# Patient Record
Sex: Female | Born: 1960 | Race: White | Hispanic: No | Marital: Married | Smoking: Never smoker
Health system: Southern US, Community
[De-identification: ages and names within clinical notes are randomized; demographics above are authoritative.]

## PROBLEM LIST (undated history)

## (undated) DIAGNOSIS — T7840XA Allergy, unspecified, initial encounter: Secondary | ICD-10-CM

## (undated) DIAGNOSIS — M62838 Other muscle spasm: Secondary | ICD-10-CM

## (undated) DIAGNOSIS — Z780 Asymptomatic menopausal state: Secondary | ICD-10-CM

## (undated) DIAGNOSIS — J45909 Unspecified asthma, uncomplicated: Secondary | ICD-10-CM

## (undated) DIAGNOSIS — I10 Essential (primary) hypertension: Secondary | ICD-10-CM

## (undated) DIAGNOSIS — Z79890 Hormone replacement therapy (postmenopausal): Secondary | ICD-10-CM

## (undated) DIAGNOSIS — K219 Gastro-esophageal reflux disease without esophagitis: Secondary | ICD-10-CM

## (undated) DIAGNOSIS — G43909 Migraine, unspecified, not intractable, without status migrainosus: Secondary | ICD-10-CM

## (undated) HISTORY — DX: Allergy, unspecified, initial encounter: T78.40XA

## (undated) HISTORY — DX: Asymptomatic menopausal state: Z78.0

## (undated) HISTORY — PX: FRACTURE SURGERY: SHX138

## (undated) HISTORY — PX: LASIK: SHX215

## (undated) HISTORY — DX: Unspecified asthma, uncomplicated: J45.909

## (undated) HISTORY — DX: Gastro-esophageal reflux disease without esophagitis: K21.9

## (undated) HISTORY — DX: Hormone replacement therapy: Z79.890

## (undated) HISTORY — DX: Other muscle spasm: M62.838

## (undated) HISTORY — DX: Essential (primary) hypertension: I10

## (undated) HISTORY — PX: APPENDECTOMY: SHX54

## (undated) HISTORY — PX: EYE SURGERY: SHX253

## (undated) HISTORY — PX: WRIST FRACTURE SURGERY: SHX121

## (undated) HISTORY — DX: Migraine, unspecified, not intractable, without status migrainosus: G43.909

---

## 2010-05-26 ENCOUNTER — Ambulatory Visit: Payer: Self-pay

## 2010-06-02 ENCOUNTER — Ambulatory Visit: Payer: Self-pay

## 2011-06-01 ENCOUNTER — Ambulatory Visit: Payer: Self-pay | Admitting: Family Medicine

## 2011-06-28 ENCOUNTER — Ambulatory Visit: Payer: Self-pay | Admitting: Family Medicine

## 2012-06-13 ENCOUNTER — Ambulatory Visit: Payer: Self-pay | Admitting: Family Medicine

## 2012-09-18 HISTORY — PX: COLONOSCOPY: SHX174

## 2013-06-17 ENCOUNTER — Ambulatory Visit: Payer: Self-pay | Admitting: Family Medicine

## 2014-03-23 ENCOUNTER — Encounter (INDEPENDENT_AMBULATORY_CARE_PROVIDER_SITE_OTHER): Payer: Self-pay

## 2014-06-24 ENCOUNTER — Ambulatory Visit: Payer: Self-pay | Admitting: Family Medicine

## 2015-03-30 ENCOUNTER — Other Ambulatory Visit: Payer: Self-pay

## 2015-05-14 ENCOUNTER — Other Ambulatory Visit: Payer: Self-pay | Admitting: Family Medicine

## 2015-05-14 DIAGNOSIS — I10 Essential (primary) hypertension: Secondary | ICD-10-CM

## 2015-06-28 ENCOUNTER — Other Ambulatory Visit: Payer: Self-pay | Admitting: Family Medicine

## 2015-06-28 DIAGNOSIS — Z1231 Encounter for screening mammogram for malignant neoplasm of breast: Secondary | ICD-10-CM

## 2015-07-08 ENCOUNTER — Ambulatory Visit
Admission: RE | Admit: 2015-07-08 | Discharge: 2015-07-08 | Disposition: A | Discharge status: Home / Self Care | Payer: 59 | Source: Ambulatory Visit | Attending: Family Medicine | Admitting: Family Medicine

## 2015-07-08 DIAGNOSIS — Z1231 Encounter for screening mammogram for malignant neoplasm of breast: Secondary | ICD-10-CM | POA: Diagnosis not present

## 2015-07-26 ENCOUNTER — Other Ambulatory Visit: Payer: Self-pay

## 2015-08-30 ENCOUNTER — Other Ambulatory Visit: Payer: Self-pay | Admitting: Family Medicine

## 2015-09-07 ENCOUNTER — Other Ambulatory Visit: Payer: Self-pay | Admitting: Family Medicine

## 2015-10-07 DIAGNOSIS — Z1283 Encounter for screening for malignant neoplasm of skin: Secondary | ICD-10-CM | POA: Diagnosis not present

## 2015-10-07 DIAGNOSIS — Z872 Personal history of diseases of the skin and subcutaneous tissue: Secondary | ICD-10-CM | POA: Diagnosis not present

## 2015-10-07 DIAGNOSIS — L720 Epidermal cyst: Secondary | ICD-10-CM | POA: Diagnosis not present

## 2015-11-04 ENCOUNTER — Other Ambulatory Visit: Payer: Self-pay

## 2015-11-04 DIAGNOSIS — G43009 Migraine without aura, not intractable, without status migrainosus: Secondary | ICD-10-CM

## 2015-11-04 DIAGNOSIS — J019 Acute sinusitis, unspecified: Secondary | ICD-10-CM

## 2015-11-04 MED ORDER — ALBUTEROL SULFATE HFA 108 (90 BASE) MCG/ACT IN AERS: 2.0000 | INHALATION_SPRAY | Freq: Four times a day (QID) | RESPIRATORY_TRACT | Status: DC | PRN

## 2015-11-04 MED ORDER — RIZATRIPTAN BENZOATE 10 MG PO TABS: 10.0000 mg | ORAL_TABLET | ORAL | Status: DC | PRN

## 2015-11-04 MED ORDER — DOXYCYCLINE HYCLATE 100 MG PO TABS: 100.0000 mg | ORAL_TABLET | Freq: Two times a day (BID) | ORAL | Status: DC

## 2015-11-26 ENCOUNTER — Other Ambulatory Visit: Payer: Self-pay | Admitting: Family Medicine

## 2015-12-09 DIAGNOSIS — H5203 Hypermetropia, bilateral: Secondary | ICD-10-CM | POA: Diagnosis not present

## 2016-02-15 ENCOUNTER — Other Ambulatory Visit: Payer: Self-pay | Admitting: Family Medicine

## 2016-02-21 ENCOUNTER — Other Ambulatory Visit: Payer: Self-pay | Admitting: Family Medicine

## 2016-05-23 ENCOUNTER — Other Ambulatory Visit: Payer: Self-pay | Admitting: Family Medicine

## 2016-06-14 ENCOUNTER — Other Ambulatory Visit: Payer: Self-pay | Admitting: Family Medicine

## 2016-06-14 DIAGNOSIS — Z1231 Encounter for screening mammogram for malignant neoplasm of breast: Secondary | ICD-10-CM

## 2016-06-20 ENCOUNTER — Other Ambulatory Visit: Payer: Self-pay | Admitting: Family Medicine

## 2016-06-20 DIAGNOSIS — J019 Acute sinusitis, unspecified: Secondary | ICD-10-CM

## 2016-07-06 ENCOUNTER — Other Ambulatory Visit: Payer: Self-pay

## 2016-07-06 DIAGNOSIS — Z23 Encounter for immunization: Secondary | ICD-10-CM

## 2016-07-10 ENCOUNTER — Ambulatory Visit
Admission: RE | Admit: 2016-07-10 | Discharge: 2016-07-10 | Disposition: A | Discharge status: Home / Self Care | Payer: 59 | Source: Ambulatory Visit | Attending: Family Medicine | Admitting: Family Medicine

## 2016-07-10 ENCOUNTER — Other Ambulatory Visit: Payer: Self-pay | Admitting: Family Medicine

## 2016-07-10 DIAGNOSIS — Z1231 Encounter for screening mammogram for malignant neoplasm of breast: Secondary | ICD-10-CM

## 2016-08-02 ENCOUNTER — Other Ambulatory Visit: Payer: Self-pay | Admitting: Family Medicine

## 2016-10-12 DIAGNOSIS — G548 Other nerve root and plexus disorders: Secondary | ICD-10-CM | POA: Diagnosis not present

## 2016-10-12 DIAGNOSIS — Z86018 Personal history of other benign neoplasm: Secondary | ICD-10-CM | POA: Diagnosis not present

## 2016-10-12 DIAGNOSIS — D485 Neoplasm of uncertain behavior of skin: Secondary | ICD-10-CM | POA: Diagnosis not present

## 2016-11-13 ENCOUNTER — Other Ambulatory Visit: Payer: Self-pay

## 2016-11-13 MED ORDER — LOSARTAN POTASSIUM-HCTZ 50-12.5 MG PO TABS: 1.0000 | ORAL_TABLET | Freq: Every day | ORAL | 1 refills | Status: DC

## 2016-12-14 DIAGNOSIS — H5203 Hypermetropia, bilateral: Secondary | ICD-10-CM | POA: Diagnosis not present

## 2017-01-24 ENCOUNTER — Other Ambulatory Visit: Payer: Self-pay

## 2017-01-24 MED ORDER — CIPROFLOXACIN HCL 500 MG PO TABS: 500.0000 mg | ORAL_TABLET | Freq: Two times a day (BID) | ORAL | 1 refills | Status: DC

## 2017-01-24 MED ORDER — FLUCONAZOLE 150 MG PO TABS: 150.0000 mg | ORAL_TABLET | Freq: Once | ORAL | 1 refills | Status: AC

## 2017-01-24 MED ORDER — METHOCARBAMOL 500 MG PO TABS: 500.0000 mg | ORAL_TABLET | Freq: Four times a day (QID) | ORAL | 0 refills | Status: DC

## 2017-01-29 ENCOUNTER — Other Ambulatory Visit: Payer: Self-pay | Admitting: Family Medicine

## 2017-01-29 DIAGNOSIS — G43009 Migraine without aura, not intractable, without status migrainosus: Secondary | ICD-10-CM

## 2017-04-27 ENCOUNTER — Other Ambulatory Visit: Payer: Self-pay

## 2017-04-27 ENCOUNTER — Other Ambulatory Visit: Payer: Self-pay | Admitting: Family Medicine

## 2017-04-27 DIAGNOSIS — E785 Hyperlipidemia, unspecified: Secondary | ICD-10-CM | POA: Diagnosis not present

## 2017-04-27 DIAGNOSIS — I10 Essential (primary) hypertension: Secondary | ICD-10-CM | POA: Diagnosis not present

## 2017-04-28 LAB — CBC WITH DIFFERENTIAL/PLATELET
BASOS ABS: 0 10*3/uL (ref 0.0–0.2)
Basos: 1 %
EOS (ABSOLUTE): 0.1 10*3/uL (ref 0.0–0.4)
Eos: 2 %
Hematocrit: 43.1 % (ref 34.0–46.6)
Hemoglobin: 13.9 g/dL (ref 11.1–15.9)
IMMATURE GRANS (ABS): 0 10*3/uL (ref 0.0–0.1)
IMMATURE GRANULOCYTES: 0 %
LYMPHS: 28 %
Lymphocytes Absolute: 1.6 10*3/uL (ref 0.7–3.1)
MCH: 29.4 pg (ref 26.6–33.0)
MCHC: 32.3 g/dL (ref 31.5–35.7)
MCV: 91 fL (ref 79–97)
Monocytes Absolute: 0.5 10*3/uL (ref 0.1–0.9)
Monocytes: 9 %
NEUTROS PCT: 60 %
Neutrophils Absolute: 3.5 10*3/uL (ref 1.4–7.0)
PLATELETS: 273 10*3/uL (ref 150–379)
RBC: 4.72 x10E6/uL (ref 3.77–5.28)
RDW: 14.1 % (ref 12.3–15.4)
WBC: 5.7 10*3/uL (ref 3.4–10.8)

## 2017-04-28 LAB — COMPREHENSIVE METABOLIC PANEL
ALT: 30 IU/L (ref 0–32)
AST: 27 IU/L (ref 0–40)
Albumin/Globulin Ratio: 1.5 (ref 1.2–2.2)
Albumin: 4.6 g/dL (ref 3.5–5.5)
Alkaline Phosphatase: 94 IU/L (ref 39–117)
BILIRUBIN TOTAL: 0.3 mg/dL (ref 0.0–1.2)
BUN/Creatinine Ratio: 18 (ref 9–23)
BUN: 18 mg/dL (ref 6–24)
CALCIUM: 9.8 mg/dL (ref 8.7–10.2)
CHLORIDE: 99 mmol/L (ref 96–106)
CO2: 26 mmol/L (ref 20–29)
Creatinine, Ser: 0.98 mg/dL (ref 0.57–1.00)
GFR calc non Af Amer: 64 mL/min/{1.73_m2} (ref >59)
GFR, EST AFRICAN AMERICAN: 74 mL/min/{1.73_m2} (ref >59)
GLUCOSE: 89 mg/dL (ref 65–99)
Globulin, Total: 3.1 g/dL (ref 1.5–4.5)
Potassium: 4.3 mmol/L (ref 3.5–5.2)
Sodium: 138 mmol/L (ref 134–144)
TOTAL PROTEIN: 7.7 g/dL (ref 6.0–8.5)

## 2017-04-28 LAB — LIPID PANEL
Chol/HDL Ratio: 3 ratio (ref 0.0–4.4)
Cholesterol, Total: 157 mg/dL (ref 100–199)
HDL: 52 mg/dL (ref >39)
LDL Calculated: 93 mg/dL (ref 0–99)
TRIGLYCERIDES: 60 mg/dL (ref 0–149)
VLDL CHOLESTEROL CAL: 12 mg/dL (ref 5–40)

## 2017-04-28 LAB — TSH: TSH: 2.04 u[IU]/mL (ref 0.450–4.500)

## 2017-05-07 ENCOUNTER — Other Ambulatory Visit: Payer: Self-pay

## 2017-05-07 MED ORDER — RANITIDINE HCL 150 MG PO TABS: 150.0000 mg | ORAL_TABLET | Freq: Two times a day (BID) | ORAL | 2 refills | Status: DC

## 2017-05-08 ENCOUNTER — Other Ambulatory Visit: Payer: Self-pay | Admitting: Family Medicine

## 2017-05-24 DIAGNOSIS — L578 Other skin changes due to chronic exposure to nonionizing radiation: Secondary | ICD-10-CM | POA: Diagnosis not present

## 2017-05-24 DIAGNOSIS — D485 Neoplasm of uncertain behavior of skin: Secondary | ICD-10-CM | POA: Diagnosis not present

## 2017-05-24 DIAGNOSIS — L918 Other hypertrophic disorders of the skin: Secondary | ICD-10-CM | POA: Diagnosis not present

## 2017-05-24 DIAGNOSIS — Z86018 Personal history of other benign neoplasm: Secondary | ICD-10-CM | POA: Diagnosis not present

## 2017-05-24 DIAGNOSIS — Z872 Personal history of diseases of the skin and subcutaneous tissue: Secondary | ICD-10-CM | POA: Diagnosis not present

## 2017-05-24 DIAGNOSIS — L821 Other seborrheic keratosis: Secondary | ICD-10-CM | POA: Diagnosis not present

## 2017-06-21 ENCOUNTER — Other Ambulatory Visit: Payer: Self-pay | Admitting: Family Medicine

## 2017-06-21 DIAGNOSIS — Z1231 Encounter for screening mammogram for malignant neoplasm of breast: Secondary | ICD-10-CM

## 2017-07-05 DIAGNOSIS — D485 Neoplasm of uncertain behavior of skin: Secondary | ICD-10-CM | POA: Diagnosis not present

## 2017-07-05 DIAGNOSIS — D229 Melanocytic nevi, unspecified: Secondary | ICD-10-CM | POA: Diagnosis not present

## 2017-07-06 ENCOUNTER — Other Ambulatory Visit: Payer: Self-pay

## 2017-07-17 ENCOUNTER — Other Ambulatory Visit: Payer: Self-pay

## 2017-07-17 MED ORDER — PREDNISONE 10 MG PO TABS: 10.0000 mg | ORAL_TABLET | Freq: Every day | ORAL | 0 refills | Status: DC

## 2017-07-18 ENCOUNTER — Ambulatory Visit
Admission: RE | Admit: 2017-07-18 | Discharge: 2017-07-18 | Disposition: A | Discharge status: Home / Self Care | Payer: 59 | Source: Ambulatory Visit | Attending: Family Medicine | Admitting: Family Medicine

## 2017-07-18 DIAGNOSIS — Z1231 Encounter for screening mammogram for malignant neoplasm of breast: Secondary | ICD-10-CM | POA: Diagnosis not present

## 2017-08-28 ENCOUNTER — Other Ambulatory Visit: Payer: Self-pay | Admitting: Family Medicine

## 2017-08-31 ENCOUNTER — Other Ambulatory Visit: Payer: Self-pay | Admitting: Family Medicine

## 2017-10-05 ENCOUNTER — Ambulatory Visit: Payer: 59

## 2017-10-05 ENCOUNTER — Ambulatory Visit
Admission: RE | Admit: 2017-10-05 | Discharge: 2017-10-05 | Disposition: A | Discharge status: Home / Self Care | Payer: 59 | Source: Ambulatory Visit | Attending: Family Medicine | Admitting: Family Medicine

## 2017-10-05 ENCOUNTER — Other Ambulatory Visit: Payer: Self-pay | Admitting: Family Medicine

## 2017-10-05 DIAGNOSIS — M25551 Pain in right hip: Secondary | ICD-10-CM

## 2017-10-05 DIAGNOSIS — M25552 Pain in left hip: Secondary | ICD-10-CM

## 2017-10-05 DIAGNOSIS — R102 Pelvic and perineal pain: Secondary | ICD-10-CM

## 2017-10-05 DIAGNOSIS — M545 Low back pain, unspecified: Secondary | ICD-10-CM

## 2017-10-05 DIAGNOSIS — M5136 Other intervertebral disc degeneration, lumbar region: Secondary | ICD-10-CM | POA: Insufficient documentation

## 2017-10-05 DIAGNOSIS — M549 Dorsalgia, unspecified: Secondary | ICD-10-CM | POA: Diagnosis not present

## 2017-10-18 DIAGNOSIS — M5440 Lumbago with sciatica, unspecified side: Secondary | ICD-10-CM | POA: Diagnosis not present

## 2017-10-18 DIAGNOSIS — E669 Obesity, unspecified: Secondary | ICD-10-CM | POA: Diagnosis not present

## 2017-10-18 DIAGNOSIS — M5416 Radiculopathy, lumbar region: Secondary | ICD-10-CM | POA: Diagnosis not present

## 2017-10-18 DIAGNOSIS — M5136 Other intervertebral disc degeneration, lumbar region: Secondary | ICD-10-CM | POA: Diagnosis not present

## 2017-11-05 ENCOUNTER — Other Ambulatory Visit: Payer: Self-pay | Admitting: Family Medicine

## 2017-11-06 ENCOUNTER — Ambulatory Visit: Payer: Self-pay | Admitting: Physical Therapy

## 2017-11-13 ENCOUNTER — Ambulatory Visit: Payer: 59 | Admitting: Physical Therapy

## 2017-12-13 LAB — TSH: TSH: 0.97 (ref 0.41–5.90)

## 2017-12-13 LAB — CBC AND DIFFERENTIAL
HCT: 39 (ref 36–46)
Hemoglobin: 13.6 (ref 12.0–16.0)
Platelets: 289 (ref 150–399)
WBC: 5.9

## 2017-12-13 LAB — HEPATIC FUNCTION PANEL
ALT: 14 (ref 7–35)
AST: 15 (ref 13–35)

## 2017-12-13 LAB — BASIC METABOLIC PANEL
BUN: 16 (ref 4–21)
Glucose: 86
POTASSIUM: 4.3 (ref 3.4–5.3)
SODIUM: 139 (ref 137–147)

## 2017-12-13 LAB — LIPID PANEL
CHOLESTEROL: 159 (ref 0–200)
HDL: 48 (ref 35–70)
LDL Cholesterol: 100
Triglycerides: 57 (ref 40–160)

## 2017-12-17 ENCOUNTER — Other Ambulatory Visit: Payer: Self-pay

## 2017-12-20 DIAGNOSIS — H5203 Hypermetropia, bilateral: Secondary | ICD-10-CM | POA: Diagnosis not present

## 2018-01-31 ENCOUNTER — Ambulatory Visit (INDEPENDENT_AMBULATORY_CARE_PROVIDER_SITE_OTHER): Payer: 59 | Admitting: Family Medicine

## 2018-01-31 ENCOUNTER — Encounter: Payer: Self-pay | Admitting: Family Medicine

## 2018-01-31 VITALS — BP 120/78 | HR 80 | Ht 64.0 in | Wt 172.0 lb

## 2018-01-31 DIAGNOSIS — R002 Palpitations: Secondary | ICD-10-CM | POA: Diagnosis not present

## 2018-01-31 DIAGNOSIS — I10 Essential (primary) hypertension: Secondary | ICD-10-CM

## 2018-01-31 DIAGNOSIS — N951 Menopausal and female climacteric states: Secondary | ICD-10-CM | POA: Diagnosis not present

## 2018-01-31 DIAGNOSIS — M51369 Other intervertebral disc degeneration, lumbar region without mention of lumbar back pain or lower extremity pain: Secondary | ICD-10-CM

## 2018-01-31 DIAGNOSIS — M5136 Other intervertebral disc degeneration, lumbar region: Secondary | ICD-10-CM

## 2018-01-31 DIAGNOSIS — K219 Gastro-esophageal reflux disease without esophagitis: Secondary | ICD-10-CM | POA: Diagnosis not present

## 2018-01-31 DIAGNOSIS — Z0001 Encounter for general adult medical examination with abnormal findings: Secondary | ICD-10-CM | POA: Diagnosis not present

## 2018-01-31 DIAGNOSIS — Z0000 Encounter for general adult medical examination without abnormal findings: Secondary | ICD-10-CM

## 2018-01-31 DIAGNOSIS — M62838 Other muscle spasm: Secondary | ICD-10-CM | POA: Insufficient documentation

## 2018-01-31 DIAGNOSIS — G43909 Migraine, unspecified, not intractable, without status migrainosus: Secondary | ICD-10-CM | POA: Insufficient documentation

## 2018-01-31 NOTE — Progress Notes (Signed)
Name: Erica Snyder   MRN: [id number]    DOB: April 08, 1961   Date:01/31/2018       Progress Note  Subjective  Chief Complaint  Chief Complaint  Patient presents with  . Annual Exam    no pap or mammo    Patient present for annual physical exam.  Patient on no medications.   No problem-specific Assessment & Plan notes found for this encounter.   Past Medical History:  Diagnosis Date  . Asthma   . GERD (gastroesophageal reflux disease)   . Hormone replacement therapy (HRT)   . Hypertension   . Menopause   . Migraine   . Migraine   . Neck muscle spasm     Past Surgical History:  Procedure Laterality Date  . APPENDECTOMY    . COLONOSCOPY  2014   polyps  . LASIK    . WRIST FRACTURE SURGERY Right     Family History  Problem Relation Age of Onset  . Hypertension Mother   . Hypertension Father   . Heart disease Father   . Stroke Father   . Breast cancer Neg Hx     Social History   Socioeconomic History  . Marital status: Married    Spouse name: Not on file  . Number of children: Not on file  . Years of education: Not on file  . Highest education level: Not on file  Occupational History  . Not on file  Social Needs  . Financial resource strain: Not on file  . Food insecurity:    Worry: Not on file    Inability: Not on file  . Transportation needs:    Medical: Not on file    Non-medical: Not on file  Tobacco Use  . Smoking status: Never Smoker  . Smokeless tobacco: Never Used  Substance and Sexual Activity  . Alcohol use: Yes    Alcohol/week: 0.0 oz  . Drug use: No  . Sexual activity: Yes  Lifestyle  . Physical activity:    Days per week: Not on file    Minutes per session: Not on file  . Stress: Not on file  Relationships  . Social connections:    Talks on phone: Not on file    Gets together: Not on file    Attends religious service: Not on file    Active member of club or organization: Not on file    Attends meetings of clubs or  organizations: Not on file    Relationship status: Not on file  . Intimate partner violence:    Fear of current or ex partner: Not on file    Emotionally abused: Not on file    Physically abused: Not on file    Forced sexual activity: Not on file  Other Topics Concern  . Not on file  Social History Narrative  . Not on file    Allergies  Allergen Reactions  . Clarithromycin Nausea And Vomiting and Other (See Comments)    Stomach pain   . Levofloxacin Other (See Comments)  . Sulfa Antibiotics Rash    Outpatient Medications Prior to Visit  Medication Sig Dispense Refill  . albuterol (VENTOLIN HFA) 108 (90 Base) MCG/ACT inhaler Inhale 1 puff into the lungs as needed.    . butalbital-acetaminophen-caffeine (FIORICET, ESGIC) 50-325-40 MG tablet TAKE 1 TABLET BY MOUTH AS NEEDED 100 tablet 0  . Butalbital-APAP-Caff-Cod 50-300-40-30 MG CAPS Take 1 capsule by mouth as needed.    . Calcium Citrate-Vitamin D (CALCIUM + D  PO) Take 1 tablet by mouth daily.    . Calcium-Vitamin D (CALTRATE 600 PLUS-VIT D PO) Take 2 tablets by mouth daily.    . ciprofloxacin (CIPRO) 500 MG tablet Take 1 tablet (500 mg total) by mouth 2 (two) times daily. 20 tablet 1  . Cyanocobalamin (VITAMIN B12) 1000 MCG TBCR Take 1 tablet by mouth daily.    Marland Kitchen estradiol (ESTRACE) 0.5 MG tablet TAKE 1 TABLET BY MOUTH ONCE DAILY 90 tablet 0  . estradiol (ESTRACE) 0.5 MG tablet Take 1 tablet by mouth daily.    Marland Kitchen losartan-hydrochlorothiazide (HYZAAR) 50-12.5 MG tablet TAKE 1 TABLET BY MOUTH DAILY 90 tablet 1  . losartan-hydrochlorothiazide (HYZAAR) 50-12.5 MG tablet Take 1 tablet by mouth daily.    . medroxyPROGESTERone (PROVERA) 5 MG tablet Take 2.5 mg by mouth daily.    . methocarbamol (ROBAXIN) 500 MG tablet TAKE 1 TABLET BY MOUTH FOUR TIMES DAILY 100 tablet 0  . methocarbamol (ROBAXIN) 500 MG tablet Take 1 tablet by mouth as needed.    . norethindrone (AYGESTIN) 5 MG tablet TAKE 1 TABLET BY MOUTH ONCE DAILY 90 tablet 1  .  norethindrone (AYGESTIN) 5 MG tablet Take 0.25 mg by mouth daily.    . ranitidine (ZANTAC) 150 MG tablet Take 1 tablet (150 mg total) by mouth 2 (two) times daily. 180 tablet 2  . ranitidine (ZANTAC) 150 MG tablet Take 1 tablet by mouth daily.    . rizatriptan (MAXALT) 10 MG tablet TAKE 1 TABLET BY MOUTH AS NEEDED FOR MIGRAINE. MAY REPEAT IN 2 HOURS IF NEEDED. 27 tablet 1  . VENTOLIN HFA 108 (90 Base) MCG/ACT inhaler INHALE 2 PUFFS INTO THE LUNGS EVERY SIX HOURS AS NEEDED FOR WHEEZING OR SHORTNESS OF BREATH 54 g 0  . predniSONE (DELTASONE) 10 MG tablet Take 1 tablet (10 mg total) by mouth daily with breakfast. Taper- 4,4,4,3,3,3,2,2,2,1,1,1 30 tablet 0   No facility-administered medications prior to visit.     Review of Systems  Constitutional: Negative for chills, fever, malaise/fatigue and weight loss.  HENT: Negative for ear discharge, ear pain, nosebleeds and sore throat.   Eyes: Negative for blurred vision and photophobia.  Respiratory: Negative for cough, sputum production, shortness of breath and wheezing.   Cardiovascular: Negative for chest pain, palpitations and leg swelling.  Gastrointestinal: Negative for abdominal pain, blood in stool, constipation, diarrhea, heartburn, melena and nausea.  Genitourinary: Negative for dysuria, frequency, hematuria and urgency.  Musculoskeletal: Positive for back pain. Negative for joint pain, myalgias and neck pain.  Skin: Negative for rash.  Neurological: Negative for dizziness, tingling, sensory change, focal weakness and headaches.  Endo/Heme/Allergies: Negative for environmental allergies and polydipsia. Does not bruise/bleed easily.  Psychiatric/Behavioral: Negative for depression and suicidal ideas. The patient is not nervous/anxious and does not have insomnia.      Objective  Vitals:   01/31/18 1143  BP: 120/78  Pulse: 80  Weight: 172 lb (78 kg)  Height: 5\' 4"  (1.626 m)    Physical Exam  Constitutional: She is oriented to  person, place, and time. She appears well-developed and well-nourished.  HENT:  Head: Normocephalic.  Right Ear: Hearing, tympanic membrane, external ear and ear canal normal.  Left Ear: Hearing, tympanic membrane, external ear and ear canal normal.  Nose: Nose normal.  Mouth/Throat: Uvula is midline, oropharynx is clear and moist and mucous membranes are normal. No oropharyngeal exudate, posterior oropharyngeal edema or posterior oropharyngeal erythema.  Eyes: Pupils are equal, round, and reactive to light. Conjunctivae, EOM and  lids are normal. Lids are everted and swept, no foreign bodies found. Left eye exhibits no hordeolum. No foreign body present in the left eye. Right conjunctiva is not injected. Left conjunctiva is not injected. No scleral icterus.  Fundoscopic exam:      The right eye shows no arteriolar narrowing and no AV nicking.       The left eye shows no arteriolar narrowing and no AV nicking.  Neck: Trachea normal and normal range of motion. Neck supple. Normal carotid pulses, no hepatojugular reflux and no JVD present. Carotid bruit is not present. No tracheal deviation present. No thyroid mass and no thyromegaly present.  Cardiovascular: Normal rate, regular rhythm, S1 normal, S2 normal, normal heart sounds and intact distal pulses. Exam reveals no gallop and no friction rub.  No murmur heard. Pulses:      Carotid pulses are 2+ on the right side, and 2+ on the left side.      Radial pulses are 2+ on the right side, and 2+ on the left side.       Femoral pulses are 2+ on the right side, and 2+ on the left side.      Popliteal pulses are 2+ on the right side, and 2+ on the left side.       Dorsalis pedis pulses are 0 on the right side, and 0 on the left side.       Posterior tibial pulses are 1+ on the right side, and 1+ on the left side.  Pulmonary/Chest: Effort normal and breath sounds normal. No respiratory distress. She has no wheezes. She has no rales.  Abdominal: Soft.  Bowel sounds are normal. She exhibits no mass. There is no hepatosplenomegaly. There is no tenderness. There is no rebound and no guarding.  Musculoskeletal: Normal range of motion. She exhibits no edema or tenderness.       Lumbar back: Normal.  Lymphadenopathy:       Head (right side): No submental and no submandibular adenopathy present.       Head (left side): No submental and no submandibular adenopathy present.    She has no cervical adenopathy.  Neurological: She is alert and oriented to person, place, and time. She has normal strength. She displays normal reflexes. No cranial nerve deficit or sensory deficit.  Reflex Scores:      Tricep reflexes are 2+ on the right side and 2+ on the left side.      Bicep reflexes are 2+ on the right side and 2+ on the left side.      Brachioradialis reflexes are 2+ on the right side and 2+ on the left side.      Patellar reflexes are 2+ on the right side and 2+ on the left side.      Achilles reflexes are 1+ on the right side and 1+ on the left side. Skin: Skin is warm, dry and intact. No rash noted.  Psychiatric: She has a normal mood and affect. Her mood appears not anxious. She does not exhibit a depressed mood.  Nursing note and vitals reviewed.     Assessment & Plan  Problem List Items Addressed This Visit      Cardiovascular and Mediastinum   Hypertension   Relevant Medications   losartan-hydrochlorothiazide (HYZAAR) 50-12.5 MG tablet     Digestive   GERD without esophagitis   Relevant Medications   ranitidine (ZANTAC) 150 MG tablet     Musculoskeletal and Integument   DDD (degenerative disc  disease), lumbar   Relevant Medications   Butalbital-APAP-Caff-Cod 50-300-40-30 MG CAPS   methocarbamol (ROBAXIN) 500 MG tablet     Other   Menopausal flushing    Other Visit Diagnoses    Annual physical exam    -  Primary   annual physical exam with no abnormalities   Healthcare maintenance       all maintenance utd/mammogram  pending   Palpitation       episodidic short interval without sxs/ekg normal rate/rhythm/no ischemic changes   Relevant Orders   EKG 12-Lead (Completed)    stable on maintenance meds- cont as prescribed No orders of the defined types were placed in this encounter.     Dr. Macon [facility]  01/31/18

## 2018-02-07 DIAGNOSIS — Z872 Personal history of diseases of the skin and subcutaneous tissue: Secondary | ICD-10-CM | POA: Diagnosis not present

## 2018-02-07 DIAGNOSIS — Z86018 Personal history of other benign neoplasm: Secondary | ICD-10-CM | POA: Diagnosis not present

## 2018-02-07 DIAGNOSIS — L821 Other seborrheic keratosis: Secondary | ICD-10-CM | POA: Diagnosis not present

## 2018-02-07 DIAGNOSIS — L578 Other skin changes due to chronic exposure to nonionizing radiation: Secondary | ICD-10-CM | POA: Diagnosis not present

## 2018-02-28 DIAGNOSIS — E669 Obesity, unspecified: Secondary | ICD-10-CM | POA: Diagnosis not present

## 2018-02-28 DIAGNOSIS — M7552 Bursitis of left shoulder: Secondary | ICD-10-CM | POA: Diagnosis not present

## 2018-02-28 DIAGNOSIS — M25512 Pain in left shoulder: Secondary | ICD-10-CM | POA: Diagnosis not present

## 2018-05-13 ENCOUNTER — Other Ambulatory Visit: Payer: Self-pay | Admitting: Family Medicine

## 2018-05-23 ENCOUNTER — Other Ambulatory Visit: Payer: Self-pay | Admitting: Family Medicine

## 2018-05-23 DIAGNOSIS — Z1231 Encounter for screening mammogram for malignant neoplasm of breast: Secondary | ICD-10-CM

## 2018-05-30 DIAGNOSIS — Z86010 Personal history of colonic polyps: Secondary | ICD-10-CM | POA: Diagnosis not present

## 2018-05-30 DIAGNOSIS — D125 Benign neoplasm of sigmoid colon: Secondary | ICD-10-CM | POA: Diagnosis not present

## 2018-05-30 LAB — HM COLONOSCOPY

## 2018-07-23 ENCOUNTER — Ambulatory Visit
Admission: RE | Admit: 2018-07-23 | Discharge: 2018-07-23 | Disposition: A | Discharge status: Home / Self Care | Payer: 59 | Source: Ambulatory Visit | Attending: Family Medicine | Admitting: Family Medicine

## 2018-07-23 DIAGNOSIS — Z1231 Encounter for screening mammogram for malignant neoplasm of breast: Secondary | ICD-10-CM | POA: Insufficient documentation

## 2018-08-30 ENCOUNTER — Other Ambulatory Visit: Payer: Self-pay

## 2018-08-30 DIAGNOSIS — R002 Palpitations: Secondary | ICD-10-CM

## 2018-09-16 ENCOUNTER — Other Ambulatory Visit: Payer: Self-pay | Admitting: Family Medicine

## 2018-10-10 DIAGNOSIS — I10 Essential (primary) hypertension: Secondary | ICD-10-CM | POA: Diagnosis not present

## 2018-10-10 DIAGNOSIS — R002 Palpitations: Secondary | ICD-10-CM | POA: Diagnosis not present

## 2018-10-11 DIAGNOSIS — R002 Palpitations: Secondary | ICD-10-CM | POA: Insufficient documentation

## 2018-11-17 ENCOUNTER — Other Ambulatory Visit: Payer: Self-pay | Admitting: Family Medicine

## 2019-01-10 ENCOUNTER — Other Ambulatory Visit (INDEPENDENT_AMBULATORY_CARE_PROVIDER_SITE_OTHER): Payer: 59

## 2019-01-10 DIAGNOSIS — Z23 Encounter for immunization: Secondary | ICD-10-CM

## 2019-02-06 DIAGNOSIS — M7552 Bursitis of left shoulder: Secondary | ICD-10-CM | POA: Diagnosis not present

## 2019-02-06 DIAGNOSIS — M25512 Pain in left shoulder: Secondary | ICD-10-CM | POA: Diagnosis not present

## 2019-02-06 DIAGNOSIS — E669 Obesity, unspecified: Secondary | ICD-10-CM | POA: Diagnosis not present

## 2019-02-11 ENCOUNTER — Other Ambulatory Visit: Payer: Self-pay | Admitting: Family Medicine

## 2019-03-06 DIAGNOSIS — H524 Presbyopia: Secondary | ICD-10-CM | POA: Diagnosis not present

## 2019-04-10 DIAGNOSIS — L821 Other seborrheic keratosis: Secondary | ICD-10-CM | POA: Diagnosis not present

## 2019-04-10 DIAGNOSIS — L814 Other melanin hyperpigmentation: Secondary | ICD-10-CM | POA: Diagnosis not present

## 2019-04-14 ENCOUNTER — Other Ambulatory Visit: Payer: Self-pay | Admitting: Family Medicine

## 2019-04-14 ENCOUNTER — Other Ambulatory Visit: Payer: Self-pay

## 2019-04-14 MED ORDER — FAMOTIDINE 40 MG PO TABS: 40.0000 mg | ORAL_TABLET | Freq: Every day | ORAL | 1 refills | Status: DC

## 2019-04-17 ENCOUNTER — Encounter: Payer: Self-pay | Admitting: Family Medicine

## 2019-04-17 ENCOUNTER — Ambulatory Visit (INDEPENDENT_AMBULATORY_CARE_PROVIDER_SITE_OTHER): Payer: 59 | Admitting: Family Medicine

## 2019-04-17 ENCOUNTER — Other Ambulatory Visit: Payer: Self-pay

## 2019-04-17 VITALS — BP 128/72 | HR 60 | Ht 64.0 in | Wt 175.0 lb

## 2019-04-17 DIAGNOSIS — Z8781 Personal history of (healed) traumatic fracture: Secondary | ICD-10-CM

## 2019-04-17 DIAGNOSIS — Z0000 Encounter for general adult medical examination without abnormal findings: Secondary | ICD-10-CM

## 2019-04-17 DIAGNOSIS — J4520 Mild intermittent asthma, uncomplicated: Secondary | ICD-10-CM

## 2019-04-17 DIAGNOSIS — Z1159 Encounter for screening for other viral diseases: Secondary | ICD-10-CM

## 2019-04-17 MED ORDER — ALBUTEROL SULFATE HFA 108 (90 BASE) MCG/ACT IN AERS: 2.0000 | INHALATION_SPRAY | Freq: Four times a day (QID) | RESPIRATORY_TRACT | 3 refills | Status: DC | PRN

## 2019-04-17 NOTE — Progress Notes (Signed)
Date:  04/17/2019   Name:  Erica Snyder   DOB:  [birth year]   MRN:  [id number]   Chief Complaint: Annual Exam (no pap/ breast exam)  Patient is a 58 year old 29 who presents for a comprehensive physical exam. The patient reports the following problems: all current medical problems stable. Health maintenance has been reviewed up to date.   Review of Systems  Constitutional: Negative.  Negative for chills, fatigue, fever and unexpected weight change.  HENT: Negative for congestion, ear discharge, ear pain, postnasal drip, rhinorrhea, sinus pressure, sinus pain, sneezing and sore throat.   Eyes: Negative for photophobia, pain, discharge, redness and itching.  Respiratory: Negative for cough, shortness of breath, wheezing and stridor.   Cardiovascular: Positive for palpitations. Negative for chest pain and leg swelling.  Gastrointestinal: Negative for abdominal pain, blood in stool, constipation, diarrhea, nausea and vomiting.  Endocrine: Negative for cold intolerance, heat intolerance, polydipsia, polyphagia and polyuria.  Genitourinary: Negative for dysuria, flank pain, frequency, hematuria, menstrual problem, pelvic pain, urgency, vaginal bleeding and vaginal discharge.  Musculoskeletal: Negative for arthralgias, back pain and myalgias.  Skin: Negative for rash.  Allergic/Immunologic: Negative for environmental allergies and food allergies.  Neurological: Positive for headaches. Negative for dizziness, weakness, light-headedness and numbness.  Hematological: Negative for adenopathy. Does not bruise/bleed easily.  Psychiatric/Behavioral: Negative for dysphoric mood. The patient is not nervous/anxious.     Patient Active Problem List   Diagnosis Date Noted  . GERD without esophagitis 01/31/2018  . Hypertension 01/31/2018  . Menopausal flushing 01/31/2018  . Migraine headache 01/31/2018  . Neck muscle spasm 01/31/2018  . DDD (degenerative disc disease), lumbar 10/18/2017     Allergies  Allergen Reactions  . Clarithromycin Nausea And Vomiting and Other (See Comments)    Stomach pain   . Levofloxacin Other (See Comments)  . Sulfa Antibiotics Rash    Past Surgical History:  Procedure Laterality Date  . APPENDECTOMY    . COLONOSCOPY  2014   polyps  . LASIK    . WRIST FRACTURE SURGERY Right     Social History   Tobacco Use  . Smoking status: Never Smoker  . Smokeless tobacco: Never Used  Substance Use Topics  . Alcohol use: Yes    Alcohol/week: 0.0 standard drinks  . Drug use: No     Medication list has been reviewed and updated.  Current Meds  Medication Sig  . albuterol (VENTOLIN HFA) 108 (90 Base) MCG/ACT inhaler Inhale 1 puff into the lungs as needed.  . butalbital-acetaminophen-caffeine (FIORICET, ESGIC) 50-325-40 MG tablet TAKE 1 TABLET BY MOUTH AS NEEDED  . Calcium Citrate-Vitamin D (CALCIUM + D PO) Take 1 tablet by mouth daily.  Marland Kitchen estradiol (ESTRACE) 0.5 MG tablet Take 1 tablet by mouth daily.  . famotidine (PEPCID) 40 MG tablet Take 1 tablet (40 mg total) by mouth daily.  . hydrochlorothiazide (MICROZIDE) 12.5 MG capsule TAKE 1 CAPSULE BY MOUTH ONCE DAILY WITH LOSARTAN  . losartan (COZAAR) 50 MG tablet TAKE 1 TABLET BY MOUTH ONCE DAILY WITH HCTZ  . medroxyPROGESTERone (PROVERA) 5 MG tablet Take 2.5 mg by mouth daily.  . methocarbamol (ROBAXIN) 500 MG tablet TAKE 1 TABLET BY MOUTH FOUR TIMES DAILY  . rizatriptan (MAXALT) 10 MG tablet TAKE 1 TABLET BY MOUTH AS NEEDED FOR MIGRAINE. MAY REPEAT IN 2 HOURS IF NEEDED.    PHQ 2/9 Scores 04/17/2019 01/31/2018  PHQ - 2 Score 0 0  PHQ- 9 Score 0 0    BP  Readings from Last 3 Encounters:  04/17/19 128/72  01/31/18 120/78    Physical Exam Vitals signs and nursing note reviewed.  Constitutional:      General: She is not in acute distress.    Appearance: Normal appearance. She is well-developed and normal weight. She is not diaphoretic.  HENT:     Head: Normocephalic and atraumatic.      Jaw: There is normal jaw occlusion.     Right Ear: Hearing, tympanic membrane, ear canal and external ear normal.     Left Ear: Hearing, tympanic membrane, ear canal and external ear normal.     Nose: Nose normal.     Right Turbinates: Not enlarged.     Left Turbinates: Not enlarged.     Mouth/Throat:     Lips: Pink.     Mouth: Mucous membranes are moist.     Dentition: Normal dentition.     Tongue: No lesions.  Eyes:     General: Lids are normal. Vision grossly intact. Gaze aligned appropriately.        Right eye: No discharge.        Left eye: No discharge.     Conjunctiva/sclera: Conjunctivae normal.     Pupils: Pupils are equal, round, and reactive to light.     Funduscopic exam:    Right eye: No AV nicking.        Left eye: No AV nicking.  Neck:     Musculoskeletal: Full passive range of motion without pain, normal range of motion and neck supple.     Thyroid: No thyroid mass, thyromegaly or thyroid tenderness.     Vascular: No carotid bruit, hepatojugular reflux or JVD.     Trachea: Trachea and phonation normal.  Cardiovascular:     Rate and Rhythm: Normal rate and regular rhythm.     Chest Wall: PMI is not displaced. No thrill.     Pulses: Normal pulses.          Carotid pulses are 2+ on the right side and 2+ on the left side.      Radial pulses are 2+ on the right side and 2+ on the left side.       Femoral pulses are 2+ on the right side and 2+ on the left side.      Popliteal pulses are 2+ on the right side and 2+ on the left side.       Dorsalis pedis pulses are 2+ on the right side and 2+ on the left side.       Posterior tibial pulses are 2+ on the right side and 2+ on the left side.     Heart sounds: Normal heart sounds, S1 normal and S2 normal. No murmur. No systolic murmur. No diastolic murmur. No friction rub. No gallop. No S3 or S4 sounds.   Pulmonary:     Effort: Pulmonary effort is normal.     Breath sounds: Normal breath sounds and air entry. No decreased  air movement. No decreased breath sounds, wheezing, rhonchi or rales.  Chest:     Chest wall: No mass.     Breasts: Breasts are symmetrical.        Right: Normal. No swelling, bleeding, inverted nipple, mass, nipple discharge, skin change or tenderness.        Left: Normal. No swelling, bleeding, inverted nipple, mass, nipple discharge, skin change or tenderness.  Abdominal:     General: Bowel sounds are normal.     Palpations: Abdomen  is soft. There is no hepatomegaly, splenomegaly or mass.     Tenderness: There is no abdominal tenderness. There is no right CVA tenderness, left CVA tenderness or guarding.  Musculoskeletal: Normal range of motion.     Lumbar back: Normal.     Right lower leg: No edema.     Left lower leg: No edema.  Lymphadenopathy:     Head:     Right side of head: No submental, submandibular or tonsillar adenopathy.     Left side of head: No submental, submandibular or tonsillar adenopathy.     Cervical: No cervical adenopathy.     Right cervical: No superficial, deep or posterior cervical adenopathy.    Left cervical: No superficial, deep or posterior cervical adenopathy.     Upper Body:     Right upper body: No supraclavicular or axillary adenopathy.     Left upper body: No supraclavicular or axillary adenopathy.  Skin:    General: Skin is warm and dry.     Capillary Refill: Capillary refill takes less than 2 seconds.  Neurological:     Mental Status: She is alert.     Cranial Nerves: Cranial nerves are intact.     Sensory: Sensation is intact.     Motor: Motor function is intact.     Deep Tendon Reflexes: Reflexes are normal and symmetric.     Reflex Scores:      Tricep reflexes are 2+ on the right side and 2+ on the left side.      Bicep reflexes are 2+ on the right side and 2+ on the left side.      Brachioradialis reflexes are 2+ on the right side and 2+ on the left side.      Patellar reflexes are 2+ on the right side and 2+ on the left side.       Achilles reflexes are 2+ on the right side and 2+ on the left side.    Wt Readings from Last 3 Encounters:  04/17/19 175 lb (79.4 kg)  01/31/18 172 lb (78 kg)    BP 128/72   Pulse 60   Ht 5\' 4"  (1.626 m)   Wt 175 lb (79.4 kg)   BMI 30.04 kg/m   Assessment and Plan: 1. Annual physical exam No subjective/objective concerns noted during history and physical exam.  Review of previous encounters and labs and medications and allergies unremarkable.  Will obtain CMP, lipid, and TSH. - Comprehensive metabolic panel - Lipid panel - TSH  2. Health maintenance examination Review of health maintenance notes to be up-to-date will be scheduled for mammogram later in the year.  Breast exam was unremarkable.  3. Need for hepatitis C screening test Patient born between [place] and will check hepatitis C titer. - Hepatitis C antibody  4. History of fracture of arm Patient with history of fractured arm with a fall.  Will check vitamin D status for possible supplementation. - Vitamin D 1,25 dihydroxy  5. Mild intermittent asthma, unspecified whether complicated Occasional mild intermittent asthma currently under control but needs rescue inhaler on a as needed basis. - albuterol (VENTOLIN HFA) 108 (90 Base) MCG/ACT inhaler; Inhale 2 puffs into the lungs every 6 (six) hours as needed for wheezing or shortness of breath.  Dispense: 54 g; Refill: 3

## 2019-04-17 NOTE — Patient Instructions (Signed)

## 2019-04-28 DIAGNOSIS — Z1159 Encounter for screening for other viral diseases: Secondary | ICD-10-CM | POA: Diagnosis not present

## 2019-04-28 DIAGNOSIS — Z0000 Encounter for general adult medical examination without abnormal findings: Secondary | ICD-10-CM | POA: Diagnosis not present

## 2019-04-28 DIAGNOSIS — Z8781 Personal history of (healed) traumatic fracture: Secondary | ICD-10-CM | POA: Diagnosis not present

## 2019-05-02 LAB — COMPREHENSIVE METABOLIC PANEL
ALT: 17 IU/L (ref 0–32)
AST: 20 IU/L (ref 0–40)
Albumin/Globulin Ratio: 1.8 (ref 1.2–2.2)
Albumin: 4.8 g/dL (ref 3.8–4.9)
Alkaline Phosphatase: 94 IU/L (ref 39–117)
BUN/Creatinine Ratio: 18 (ref 9–23)
BUN: 19 mg/dL (ref 6–24)
Bilirubin Total: 0.3 mg/dL (ref 0.0–1.2)
CO2: 26 mmol/L (ref 20–29)
Calcium: 10.9 mg/dL — ABNORMAL HIGH (ref 8.7–10.2)
Chloride: 97 mmol/L (ref 96–106)
Creatinine, Ser: 1.08 mg/dL — ABNORMAL HIGH (ref 0.57–1.00)
GFR calc Af Amer: 65 mL/min/{1.73_m2} (ref >59)
GFR calc non Af Amer: 56 mL/min/{1.73_m2} — ABNORMAL LOW (ref >59)
Globulin, Total: 2.6 g/dL (ref 1.5–4.5)
Glucose: 101 mg/dL — ABNORMAL HIGH (ref 65–99)
Potassium: 4.9 mmol/L (ref 3.5–5.2)
Sodium: 138 mmol/L (ref 134–144)
Total Protein: 7.4 g/dL (ref 6.0–8.5)

## 2019-05-02 LAB — VITAMIN D 1,25 DIHYDROXY
Vitamin D 1, 25 (OH)2 Total: 44 pg/mL
Vitamin D2 1, 25 (OH)2: <10 pg/mL
Vitamin D3 1, 25 (OH)2: 44 pg/mL

## 2019-05-02 LAB — LIPID PANEL
Chol/HDL Ratio: 3.1 ratio (ref 0.0–4.4)
Cholesterol, Total: 190 mg/dL (ref 100–199)
HDL: 61 mg/dL (ref >39)
LDL Calculated: 117 mg/dL — ABNORMAL HIGH (ref 0–99)
Triglycerides: 60 mg/dL (ref 0–149)
VLDL Cholesterol Cal: 12 mg/dL (ref 5–40)

## 2019-05-02 LAB — HEPATITIS C ANTIBODY: Hep C Virus Ab: 0.1 s/co ratio (ref 0.0–0.9)

## 2019-05-02 LAB — TSH: TSH: 1.54 u[IU]/mL (ref 0.450–4.500)

## 2019-05-08 ENCOUNTER — Other Ambulatory Visit: Payer: Self-pay | Admitting: Family Medicine

## 2019-05-08 DIAGNOSIS — G43709 Chronic migraine without aura, not intractable, without status migrainosus: Secondary | ICD-10-CM

## 2019-05-08 MED ORDER — BUTALBITAL-APAP-CAFFEINE 50-325-40 MG PO TABS: 1.0000 | ORAL_TABLET | ORAL | 0 refills | Status: DC | PRN

## 2019-06-20 ENCOUNTER — Other Ambulatory Visit: Payer: Self-pay

## 2019-06-20 DIAGNOSIS — B3731 Acute candidiasis of vulva and vagina: Secondary | ICD-10-CM

## 2019-06-20 DIAGNOSIS — B373 Candidiasis of vulva and vagina: Secondary | ICD-10-CM

## 2019-06-20 MED ORDER — FLUCONAZOLE 100 MG PO TABS: 100.0000 mg | ORAL_TABLET | Freq: Every day | ORAL | 0 refills | Status: DC

## 2019-06-20 NOTE — Progress Notes (Unsigned)
Diflucan sent in

## 2019-06-23 ENCOUNTER — Other Ambulatory Visit: Payer: Self-pay | Admitting: Family Medicine

## 2019-06-23 DIAGNOSIS — Z1231 Encounter for screening mammogram for malignant neoplasm of breast: Secondary | ICD-10-CM

## 2019-07-03 DIAGNOSIS — M7552 Bursitis of left shoulder: Secondary | ICD-10-CM | POA: Diagnosis not present

## 2019-07-03 DIAGNOSIS — E669 Obesity, unspecified: Secondary | ICD-10-CM | POA: Diagnosis not present

## 2019-08-11 ENCOUNTER — Other Ambulatory Visit: Payer: Self-pay

## 2019-08-11 MED ORDER — METOPROLOL SUCCINATE ER 25 MG PO TB24: 25.0000 mg | ORAL_TABLET | Freq: Every day | ORAL | 1 refills | Status: DC

## 2019-08-21 ENCOUNTER — Other Ambulatory Visit: Payer: Self-pay

## 2019-08-21 ENCOUNTER — Ambulatory Visit
Admission: RE | Admit: 2019-08-21 | Discharge: 2019-08-21 | Disposition: A | Discharge status: Home / Self Care | Payer: 59 | Source: Ambulatory Visit | Attending: Family Medicine | Admitting: Family Medicine

## 2019-08-21 DIAGNOSIS — Z1231 Encounter for screening mammogram for malignant neoplasm of breast: Secondary | ICD-10-CM | POA: Insufficient documentation

## 2019-09-25 ENCOUNTER — Other Ambulatory Visit: Payer: Self-pay | Admitting: Family Medicine

## 2019-11-17 ENCOUNTER — Other Ambulatory Visit: Payer: Self-pay

## 2019-11-17 MED ORDER — FAMOTIDINE 40 MG PO TABS: 40.0000 mg | ORAL_TABLET | Freq: Every day | ORAL | 0 refills | Status: DC

## 2019-12-04 DIAGNOSIS — J301 Allergic rhinitis due to pollen: Secondary | ICD-10-CM | POA: Diagnosis not present

## 2019-12-04 DIAGNOSIS — J342 Deviated nasal septum: Secondary | ICD-10-CM | POA: Diagnosis not present

## 2019-12-04 DIAGNOSIS — J3489 Other specified disorders of nose and nasal sinuses: Secondary | ICD-10-CM | POA: Diagnosis not present

## 2019-12-04 DIAGNOSIS — H903 Sensorineural hearing loss, bilateral: Secondary | ICD-10-CM | POA: Diagnosis not present

## 2020-02-17 ENCOUNTER — Other Ambulatory Visit: Payer: Self-pay | Admitting: Family Medicine

## 2020-02-17 NOTE — Telephone Encounter (Signed)
Requested medication (s) are due for refill today: yes  Requested medication (s) are on the active medication list: yes  Last refill:  Hydrochlorothiazide-02/11/19 #90 with 3 refills, Losartan 02/11/19 #90 with 3 refills  Future visit scheduled: no  Notes to clinic:  Patient called to schedule CPE. No answer, left message to return call to schedule appt. Unable to refill per protocol, will send to office for review    Requested Prescriptions  Pending Prescriptions Disp Refills   hydrochlorothiazide (MICROZIDE) 12.5 MG capsule [Pharmacy Med Name: HYDROCHLOROTHIAZIDE 12.5 MG 12.5 Capsule] 90 capsule 3    Sig: TAKE 1 CAPSULE BY MOUTH ONCE DAILY WITH LOSARTAN      Cardiovascular: Diuretics - Thiazide Failed - 02/17/2020  7:40 AM      Failed - Ca in normal range and within 360 days    Calcium  Date Value Ref Range Status  04/28/2019 10.9 (H) 8.7 - 10.2 mg/dL Final          Failed - Cr in normal range and within 360 days    Creatinine, Ser  Date Value Ref Range Status  04/28/2019 1.08 (H) 0.57 - 1.00 mg/dL Final          Failed - Valid encounter within last 6 months    Recent Outpatient Visits           10 months ago Annual physical exam   [facility] Juline Patch, MD   2 years ago Annual physical exam   [facility] Juline Patch, MD              Passed - K in normal range and within 360 days    Potassium  Date Value Ref Range Status  04/28/2019 4.9 3.5 - 5.2 mmol/L Final          Passed - Na in normal range and within 360 days    Sodium  Date Value Ref Range Status  04/28/2019 138 134 - 144 mmol/L Final          Passed - Last BP in normal range    BP Readings from Last 1 Encounters:  04/17/19 128/72            losartan (COZAAR) 50 MG tablet [Pharmacy Med Name: LOSARTAN POTASSIUM 50 MG TA 50 Tablet] 90 tablet 3    Sig: TAKE 1 TABLET BY MOUTH ONCE DAILY WITH HCTZ      Cardiovascular:  Angiotensin Receptor Blockers Failed - 02/17/2020   7:40 AM      Failed - Cr in normal range and within 180 days    Creatinine, Ser  Date Value Ref Range Status  04/28/2019 1.08 (H) 0.57 - 1.00 mg/dL Final          Failed - K in normal range and within 180 days    Potassium  Date Value Ref Range Status  04/28/2019 4.9 3.5 - 5.2 mmol/L Final          Failed - Valid encounter within last 6 months    Recent Outpatient Visits           10 months ago Annual physical exam   [facility] Juline Patch, [facility]   2 years ago Annual physical exam   [facility] Juline Patch, MD              Passed - Patient is not pregnant      Passed - Last BP in normal range  BP Readings from Last 1 Encounters:  04/17/19 128/72

## 2020-03-18 DIAGNOSIS — H524 Presbyopia: Secondary | ICD-10-CM | POA: Diagnosis not present

## 2020-04-29 ENCOUNTER — Ambulatory Visit (INDEPENDENT_AMBULATORY_CARE_PROVIDER_SITE_OTHER): Payer: 59 | Admitting: Family Medicine

## 2020-04-29 ENCOUNTER — Encounter: Payer: Self-pay | Admitting: Family Medicine

## 2020-04-29 ENCOUNTER — Other Ambulatory Visit: Payer: Self-pay

## 2020-04-29 VITALS — BP 122/82 | HR 82 | Ht 64.0 in | Wt 174.0 lb

## 2020-04-29 DIAGNOSIS — Z23 Encounter for immunization: Secondary | ICD-10-CM

## 2020-04-29 DIAGNOSIS — Z0000 Encounter for general adult medical examination without abnormal findings: Secondary | ICD-10-CM | POA: Diagnosis not present

## 2020-04-29 NOTE — Progress Notes (Signed)
Date:  04/29/2020   Name:  Erica Snyder   DOB:  15-Feb-1961   MRN:  [id number]   Chief Complaint: Annual Exam (no breast exam no pap)  Patient is a 59 year old female who presents for a comprehensive physical exam. The patient reports the following problems: none. Health maintenance has been reviewed up to date.   Lab Results  Component Value Date   CREATININE 1.08 (H) 04/28/2019   BUN 19 04/28/2019   NA 138 04/28/2019   K 4.9 04/28/2019   CL 97 04/28/2019   CO2 26 04/28/2019   Lab Results  Component Value Date   CHOL 190 04/28/2019   HDL 61 04/28/2019   LDLCALC 117 (H) 04/28/2019   TRIG 60 04/28/2019   CHOLHDL 3.1 04/28/2019   Lab Results  Component Value Date   TSH 1.540 04/28/2019   No results found for: HGBA1C Lab Results  Component Value Date   WBC 5.9 12/13/2017   HGB 13.6 12/13/2017   HCT 39 12/13/2017   MCV 91 04/27/2017   PLT 289 12/13/2017   Lab Results  Component Value Date   ALT 17 04/28/2019   AST 20 04/28/2019   ALKPHOS 94 04/28/2019   BILITOT 0.3 04/28/2019     Review of Systems  Constitutional: Negative.  Negative for chills, fatigue, fever and unexpected weight change.  HENT: Negative for congestion, ear discharge, ear pain, rhinorrhea, sinus pressure, sneezing and sore throat.   Eyes: Negative for photophobia, pain, discharge, redness and itching.  Respiratory: Negative for cough, chest tightness, shortness of breath, wheezing and stridor.   Cardiovascular: Negative for chest pain, palpitations and leg swelling.  Gastrointestinal: Negative for abdominal pain, blood in stool, constipation, diarrhea, nausea and vomiting.  Endocrine: Negative for cold intolerance, heat intolerance, polydipsia, polyphagia and polyuria.  Genitourinary: Negative for dysuria, flank pain, frequency, hematuria, menstrual problem, pelvic pain, urgency, vaginal bleeding and vaginal discharge.  Musculoskeletal: Negative for arthralgias, back pain and myalgias.   Skin: Negative for rash.  Allergic/Immunologic: Negative for environmental allergies and food allergies.  Neurological: Negative for dizziness, weakness, light-headedness, numbness and headaches.  Hematological: Negative for adenopathy. Does not bruise/bleed easily.  Psychiatric/Behavioral: Negative for dysphoric mood. The patient is not nervous/anxious.     Patient Active Problem List   Diagnosis Date Noted  . GERD without esophagitis 01/31/2018  . Hypertension 01/31/2018  . Menopausal flushing 01/31/2018  . Migraine headache 01/31/2018  . Neck muscle spasm 01/31/2018  . DDD (degenerative disc disease), lumbar 10/18/2017    Allergies  Allergen Reactions  . Clarithromycin Nausea And Vomiting and Other (See Comments)    Stomach pain   . Levofloxacin Other (See Comments)  . Sulfa Antibiotics Rash    Past Surgical History:  Procedure Laterality Date  . APPENDECTOMY    . COLONOSCOPY  2014   polyps  . LASIK    . WRIST FRACTURE SURGERY Right     Social History   Tobacco Use  . Smoking status: Never Smoker  . Smokeless tobacco: Never Used  Substance Use Topics  . Alcohol use: Yes    Alcohol/week: 0.0 standard drinks  . Drug use: No     Medication list has been reviewed and updated.  Current Meds  Medication Sig  . albuterol (VENTOLIN HFA) 108 (90 Base) MCG/ACT inhaler Inhale 2 puffs into the lungs every 6 (six) hours as needed for wheezing or shortness of breath.  . butalbital-acetaminophen-caffeine (FIORICET) 50-325-40 MG tablet Take 1 tablet by mouth  as needed.  . docusate sodium (COLACE) 100 MG capsule Take 100 mg by mouth daily.  . famotidine (PEPCID) 40 MG tablet Take 1 tablet (40 mg total) by mouth daily.  . fluticasone (FLONASE) 50 MCG/ACT nasal spray Place 2 sprays into both nostrils daily.  . hydrochlorothiazide (MICROZIDE) 12.5 MG capsule TAKE 1 CAPSULE BY MOUTH ONCE DAILY WITH LOSARTAN  . losartan (COZAAR) 50 MG tablet TAKE 1 TABLET BY MOUTH ONCE DAILY  WITH HCTZ  . Magnesium 400 MG CAPS Take 1 capsule by mouth daily.  . methocarbamol (ROBAXIN) 500 MG tablet TAKE 1 TABLET BY MOUTH FOUR TIMES DAILY  . rizatriptan (MAXALT) 10 MG tablet TAKE 1 TABLET BY MOUTH AS NEEDED FOR MIGRAINE. MAY REPEAT IN 2 HOURS IF NEEDED.  . Vitamin D, Cholecalciferol, 25 MCG (1000 UT) TABS Take 1 tablet by mouth daily.    PHQ 2/9 Scores 04/29/2020 04/17/2019 01/31/2018  PHQ - 2 Score 0 0 0  PHQ- 9 Score 0 0 0    GAD 7 : Generalized Anxiety Score 04/29/2020  Nervous, Anxious, on Edge 0  Control/stop worrying 0  Worry too much - different things 0  Trouble relaxing 0  Restless 0  Easily annoyed or irritable 0  Afraid - awful might happen 0  Total GAD 7 Score 0  Anxiety Difficulty Not difficult at all    BP Readings from Last 3 Encounters:  04/29/20 122/82  04/17/19 128/72  01/31/18 120/78    Physical Exam Vitals and nursing note reviewed.  Constitutional:      Appearance: She is well-developed.  HENT:     Head: Normocephalic.     Right Ear: Tympanic membrane, ear canal and external ear normal.     Left Ear: Tympanic membrane, ear canal and external ear normal.     Nose: Nose normal.     Mouth/Throat:     Mouth: Mucous membranes are moist.  Eyes:     General: Lids are everted, no foreign bodies appreciated. Vision grossly intact. No scleral icterus.    Conjunctiva/sclera: Conjunctivae normal.     Right eye: Right conjunctiva is not injected.     Left eye: Left conjunctiva is not injected.     Pupils: Pupils are equal, round, and reactive to light.  Neck:     Thyroid: No thyromegaly.     Vascular: No JVD.     Trachea: No tracheal deviation.  Cardiovascular:     Rate and Rhythm: Normal rate and regular rhythm.     Heart sounds: Normal heart sounds. No murmur heard.  No friction rub. No gallop.   Pulmonary:     Effort: Pulmonary effort is normal. No respiratory distress.     Breath sounds: Normal breath sounds. No wheezing, rhonchi or rales.    Abdominal:     General: Bowel sounds are normal.     Palpations: Abdomen is soft. There is no mass.     Tenderness: There is no abdominal tenderness. There is no guarding or rebound.  Musculoskeletal:        General: No tenderness. Normal range of motion.     Cervical back: Normal range of motion and neck supple.  Lymphadenopathy:     Cervical: No cervical adenopathy.  Skin:    General: Skin is warm.     Capillary Refill: Capillary refill takes less than 2 seconds.     Coloration: Skin is not jaundiced or pale.     Findings: No bruising, erythema, lesion or rash.  Neurological:  Mental Status: She is alert and oriented to person, place, and time.     Cranial Nerves: No cranial nerve deficit.     Deep Tendon Reflexes: Reflexes normal.  Psychiatric:        Mood and Affect: Mood is not anxious or depressed.     Wt Readings from Last 3 Encounters:  04/29/20 174 lb (78.9 kg)  04/17/19 175 lb (79.4 kg)  01/31/18 172 lb (78 kg)    BP 122/82   Pulse 82   Ht 5\' 4"  (1.626 m)   Wt 174 lb (78.9 kg)   SpO2 97%   BMI 29.87 kg/m   Assessment and Plan: 1. Annual physical exam No subjective/objective concerns noted during history and physical exam.  Patient's chart was reviewed for previous encounters, most recent labs, most recent imaging, as well as care elsewhere.Zanayah Shadowens is a 59 y.o. female who presents today for her Complete Annual Exam. She feels well. She reports exercising at times. She reports she is sleeping fairly well. Immunizations are reviewed and recommendations provided.   Age appropriate screening tests are discussed. Counseling given for risk factor reduction interventions.  2. Vaccine for VZV (varicella-zoster virus) Discussed and administered. - Varicella-zoster vaccine IM (Shingrix)

## 2020-04-30 ENCOUNTER — Other Ambulatory Visit: Payer: Self-pay

## 2020-05-08 DIAGNOSIS — M7652 Patellar tendinitis, left knee: Secondary | ICD-10-CM | POA: Diagnosis not present

## 2020-05-17 ENCOUNTER — Other Ambulatory Visit: Payer: Self-pay | Admitting: Family Medicine

## 2020-05-17 NOTE — Telephone Encounter (Signed)
Rx filled per visit protocol- needs labs- note on Rx to get follow up labs

## 2020-05-28 DIAGNOSIS — E669 Obesity, unspecified: Secondary | ICD-10-CM | POA: Diagnosis not present

## 2020-05-28 DIAGNOSIS — M7552 Bursitis of left shoulder: Secondary | ICD-10-CM | POA: Diagnosis not present

## 2020-08-11 ENCOUNTER — Other Ambulatory Visit: Payer: Self-pay | Admitting: Family Medicine

## 2020-08-11 DIAGNOSIS — Z1231 Encounter for screening mammogram for malignant neoplasm of breast: Secondary | ICD-10-CM

## 2020-08-23 ENCOUNTER — Other Ambulatory Visit: Payer: Self-pay | Admitting: Family Medicine

## 2020-08-23 NOTE — Telephone Encounter (Signed)
Requested medication (s) are due for refill today - yes  Requested medication (s) are on the active medication list - yes  Future visit scheduled -no  Last refill: 05/17/20  Notes to clinic: Patient passes visit protocol- but needs labs scheduled to pass lab protocol- sent for review  Requested Prescriptions  Pending Prescriptions Disp Refills   losartan (COZAAR) 50 MG tablet [Pharmacy Med Name: LOSARTAN POTASSIUM 50 MG TA 50 Tablet] 90 tablet 0    Sig: TAKE 1 TABLET BY MOUTH ONCE DAILY WITH HCTZ      Cardiovascular:  Angiotensin Receptor Blockers Failed - 08/23/2020  7:42 AM      Failed - Cr in normal range and within 180 days    Creatinine, Ser  Date Value Ref Range Status  04/28/2019 1.08 (H) 0.57 - 1.00 mg/dL Final          Failed - K in normal range and within 180 days    Potassium  Date Value Ref Range Status  04/28/2019 4.9 3.5 - 5.2 mmol/L Final          Passed - Patient is not pregnant      Passed - Last BP in normal range    BP Readings from Last 1 Encounters:  04/29/20 122/82          Passed - Valid encounter within last 6 months    Recent Outpatient Visits           3 months ago Annual physical exam   [facility] Juline Patch, [facility]   1 year ago Annual physical exam   [facility]ine Patch, [facility]   2 years ago Annual physical exam   [facility] Juline Patch, MD                hydrochlorothiazide (HYDRODIURIL) 12.5 MG tablet [Pharmacy Med Name: HYDROCHLOROTHIAZIDE 12.5 MG 12.5 Tablet] 90 tablet 0    Sig: TAKE 1 CAPSULE BY MOUTH ONCE DAILY WITH LOSARTAN      Cardiovascular: Diuretics - Thiazide Failed - 08/23/2020  7:42 AM      Failed - Ca in normal range and within 360 days    Calcium  Date Value Ref Range Status  04/28/2019 10.9 (H) 8.7 - 10.2 mg/dL Final          Failed - Cr in normal range and within 360 days    Creatinine, Ser  Date Value Ref Range Status  04/28/2019 1.08 (H) 0.57 - 1.00 mg/dL Final           Failed - K in normal range and within 360 days    Potassium  Date Value Ref Range Status  04/28/2019 4.9 3.5 - 5.2 mmol/L Final          Failed - Na in normal range and within 360 days    Sodium  Date Value Ref Range Status  04/28/2019 138 134 - 144 mmol/L Final          Passed - Last BP in normal range    BP Readings from Last 1 Encounters:  04/29/20 122/82          Passed - Valid encounter within last 6 months    Recent Outpatient Visits           3 months ago Annual physical exam   [facility]   1 year ago Annual physical exam   [facility], [facility]  2 years ago Annual physical exam   [facility] Juline Patch, MD               Signed Prescriptions Disp Refills   famotidine (PEPCID) 40 MG tablet 90 tablet 0    Sig: TAKE 1 TABLET BY MOUTH DAILY      Gastroenterology:  H2 Antagonists Passed - 08/23/2020  7:42 AM      Passed - Valid encounter within last 12 months    Recent Outpatient Visits           3 months ago Annual physical exam   [facility] Juline Patch, MD   1 year ago Annual physical exam   [facility] Juline Patch, [facility]   2 years ago Annual physical exam   [facility] Juline Patch, MD                  Requested Prescriptions  Pending Prescriptions Disp Refills   losartan (COZAAR) 50 MG tablet [Pharmacy Med Name: LOSARTAN POTASSIUM 50 MG TA 50 Tablet] 90 tablet 0    Sig: TAKE 1 TABLET BY MOUTH ONCE DAILY WITH HCTZ      Cardiovascular:  Angiotensin Receptor Blockers Failed - 08/23/2020  7:42 AM      Failed - Cr in normal range and within 180 days    Creatinine, Ser  Date Value Ref Range Status  04/28/2019 1.08 (H) 0.57 - 1.00 mg/dL Final          Failed - K in normal range and within 180 days    Potassium  Date Value Ref Range Status  04/28/2019 4.9 3.5 - 5.2 mmol/L Final          Passed - Patient is not pregnant       Passed - Last BP in normal range    BP Readings from Last 1 Encounters:  04/29/20 122/82          Passed - Valid encounter within last 6 months    Recent Outpatient Visits           3 months ago Annual physical exam   [facility] Juline Patch, MD   1 year ago Annual physical exam   [facility] Juline Patch, MD   2 years ago Annual physical exam   [facility] Juline Patch, MD                hydrochlorothiazide (HYDRODIURIL) 12.5 MG tablet [Pharmacy Med Name: HYDROCHLOROTHIAZIDE 12.5 MG 12.5 Tablet] 90 tablet 0    Sig: TAKE 1 CAPSULE BY MOUTH ONCE DAILY WITH LOSARTAN      Cardiovascular: Diuretics - Thiazide Failed - 08/23/2020  7:42 AM      Failed - Ca in normal range and within 360 days    Calcium  Date Value Ref Range Status  04/28/2019 10.9 (H) 8.7 - 10.2 mg/dL Final          Failed - Cr in normal range and within 360 days    Creatinine, Ser  Date Value Ref Range Status  04/28/2019 1.08 (H) 0.57 - 1.00 mg/dL Final          Failed - K in normal range and within 360 days    Potassium  Date Value Ref Range Status  04/28/2019 4.9 3.5 - 5.2 mmol/L Final          Failed - Na in normal range and within 360  days    Sodium  Date Value Ref Range Status  04/28/2019 138 134 - 144 mmol/L Final          Passed - Last BP in normal range    BP Readings from Last 1 Encounters:  04/29/20 122/82          Passed - Valid encounter within last 6 months    Recent Outpatient Visits           3 months ago Annual physical exam   [facility], [facility]   1 year ago Annual physical exam   [facility] Juline Patch, [facility]   2 years ago Annual physical exam   [facility] Juline Patch, MD               Signed Prescriptions Disp Refills   famotidine (PEPCID) 40 MG tablet 90 tablet 0    Sig: TAKE 1 TABLET BY MOUTH DAILY      Gastroenterology:  H2 Antagonists Passed - 08/23/2020   7:42 AM      Passed - Valid encounter within last 12 months    Recent Outpatient Visits           3 months ago Annual physical exam   [facility] Juline Patch, MD   1 year ago Annual physical exam   [facility] Juline Patch, MD   2 years ago Annual physical exam   [facility] Juline Patch, MD

## 2020-08-31 ENCOUNTER — Ambulatory Visit: Payer: 59

## 2020-09-08 ENCOUNTER — Ambulatory Visit
Admission: RE | Admit: 2020-09-08 | Discharge: 2020-09-08 | Disposition: A | Discharge status: Home / Self Care | Payer: 59 | Source: Ambulatory Visit | Attending: Family Medicine | Admitting: Family Medicine

## 2020-09-08 ENCOUNTER — Other Ambulatory Visit: Payer: Self-pay

## 2020-09-08 DIAGNOSIS — Z1231 Encounter for screening mammogram for malignant neoplasm of breast: Secondary | ICD-10-CM | POA: Insufficient documentation

## 2020-09-13 ENCOUNTER — Encounter: Payer: Self-pay | Admitting: Family Medicine

## 2020-10-12 DIAGNOSIS — Z124 Encounter for screening for malignant neoplasm of cervix: Secondary | ICD-10-CM | POA: Diagnosis not present

## 2020-10-12 DIAGNOSIS — Z01419 Encounter for gynecological examination (general) (routine) without abnormal findings: Secondary | ICD-10-CM | POA: Diagnosis not present

## 2020-10-12 LAB — HM PAP SMEAR: HM Pap smear: NORMAL

## 2020-11-05 ENCOUNTER — Other Ambulatory Visit (INDEPENDENT_AMBULATORY_CARE_PROVIDER_SITE_OTHER): Payer: 59

## 2020-11-05 DIAGNOSIS — Z23 Encounter for immunization: Secondary | ICD-10-CM

## 2020-11-22 ENCOUNTER — Other Ambulatory Visit: Payer: Self-pay | Admitting: Family Medicine

## 2020-12-20 ENCOUNTER — Other Ambulatory Visit: Payer: Self-pay

## 2020-12-20 ENCOUNTER — Ambulatory Visit (INDEPENDENT_AMBULATORY_CARE_PROVIDER_SITE_OTHER): Payer: 59 | Admitting: Family Medicine

## 2020-12-20 ENCOUNTER — Encounter: Payer: Self-pay | Admitting: Family Medicine

## 2020-12-20 VITALS — BP 118/68 | HR 71 | Temp 98.1°F | Ht 62.0 in | Wt 170.0 lb

## 2020-12-20 DIAGNOSIS — M722 Plantar fascial fibromatosis: Secondary | ICD-10-CM | POA: Diagnosis not present

## 2020-12-20 MED ORDER — TRIAMCINOLONE ACETONIDE 40 MG/ML IJ SUSP
40.0000 mg | Freq: Once | INTRAMUSCULAR | Status: AC
Start: 2020-12-20 — End: 2020-12-20
  Administered 2020-12-20: 40 mg via INTRA_ARTICULAR

## 2020-12-20 MED ORDER — TRIAMCINOLONE ACETONIDE 40 MG/ML IJ SUSP: 40.0000 mg | Freq: Once | INTRAMUSCULAR | Status: DC

## 2020-12-20 NOTE — Patient Instructions (Addendum)
You have just been given a cortisone injection to reduce pain and inflammation. After the injection you may notice immediate relief of pain as a result of the Lidocaine. It is important to rest the area of the injection for 24 to 48 hours after the injection. There is a possibility of some temporary increased discomfort and swelling for up to 72 hours until the cortisone begins to work. If you do have pain, simply rest the joint and use ice. If you can tolerate over the counter medications, you can try Tylenol, Aleve, or Advil for added relief per package instructions.   - As above, relative rest then gradually return to normal activity - Ice, OTC medications as-needed for additional pain control - Restart stretches as symptoms allow after appropriate rest period - Contact for questions, follow-up as-needed  Plantar Fasciitis Exercises  Your healthcare provider may recommend exercises to help you heal. Talk to your healthcare provider or physical therapist about which exercises will best help you and how to do them correctly and safely. An over-the-counter shoe orthotic might also be helpful. You can buy these at an outdoor recreation store or [employer]. You may start strengthening the muscles of your hip and stretching the muscles of your foot right away. . Prone hip extension: Lie on your stomach with your legs straight out behind you. Fold your arms under your head and rest your head on your arms. Draw your belly button in towards your spine and tighten your abdominal muscles. Tighten the buttocks and thigh muscles of the leg on your injured side and lift the leg off the floor about 8 inches. Keep your leg straight. Hold for 5 seconds. Then lower your leg and relax. Do 2 sets of 15. . Side-lying leg lift: Lie on your uninjured side. Tighten the front thigh muscles on your injured leg and lift that leg 8 to 10 inches (20 to 25 centimeters) away from the other leg. Keep the leg straight and lower it  slowly. Do 2 sets of 15. . Frozen can roll: Roll your bare injured foot back and forth from your heel to your mid-arch over a frozen juice can. Repeat for 3 to 5 minutes. This exercise is particularly helpful if it is done first thing in the morning. . Towel stretch: Sit on a hard surface with your injured leg stretched out in front of you. Loop a towel around your toes and the ball of your foot and pull the towel toward your body keeping your leg straight. Hold this position for 15 to 30 seconds and then relax. Repeat 3 times. . Standing calf stretch: Stand facing a wall with your hands on the wall at about eye level. Keep your injured leg back with your heel on the floor. Keep the other leg forward with the knee bent. Turn your back foot slightly inward (as if you were pigeon-toed). Slowly lean into the wall until you feel a stretch in the back of your calf. Hold the stretch for 15 to 30 seconds. Return to the starting position. Repeat 3 times. Do this exercise several times each day. . Seated plantar fascia stretch: Sit in a chair and cross the injured foot over the knee of your other leg. Place your fingers over the base of your toes and pull them back toward your shin until you feel a comfortable stretch in the arch of your foot. Hold 15 seconds and repeat 3 times. . Plantar fascia massage: Sit in a chair and cross  the injured foot over the knee of your other leg. Place your fingers over the base of the toes of your injured foot and pull your toes toward your shin until you feel a stretch in the arch of your foot. With your other hand, massage the bottom of your foot, moving from the heel toward your toes. Do this for 3 to 5 minutes. Start gently. Press harder on the bottom of your foot as you become able to tolerate more pressure. . Achilles stretch: Stand with the ball of one foot on a stair. Reach for the step below with your heel until you feel a stretch in the arch of your foot. Hold this position  for 15 to 30 seconds and then relax. Repeat 3 times. . Standing hamstring stretch: Put the heel of the leg on your injured side on a stool about 15 inches high. Keep your leg straight. Lean forward, bending at the hips, until you feel a mild stretch in the back of your thigh. Make sure you don't roll your shoulders or bend at the waist when doing this or you will stretch your lower back instead of your leg. Hold the stretch for 15 to 30 seconds. Repeat 3 times. After you have stretched the bottom muscles of your foot, you can do these exercises to start strengthening the muscles of your foot. Vinnie Langton pickup: With your heel on the ground, pick up a towel with your toes. Release. Do 3 sets of 20. When this gets easy, add more resistance by placing a book or small weight on the towel. . Balance and reach exercises: Stand next to a chair with your injured leg farther from the chair. The chair will provide support if you need it. Stand on the foot of your injured leg and bend your knee slightly. 1. With the hand that is farther away from the chair, reach forward in front of you by bending at the waist. Avoid bending your knee any more as you do this. Repeat this 15 times. To make the exercise more challenging, reach farther in front of you. Do 2 sets of 15. 2. Reach the hand that is farther away from the chair across your body toward the chair. The farther you reach, the more challenging the exercise. Do 2 sets of 15. Marland Kitchen Heel raise: Stand behind a chair or counter with both feet flat on the floor. Using the chair or counter as a support, rise up onto your toes and hold for 5 seconds. Then slowly lower yourself down without holding onto the support. (It's OK to keep holding onto the support if you need to.) When this exercise becomes less painful, try doing this exercise while you are standing on the injured leg only. Repeat 15 times. Do 2 sets of 15. Rest 30 seconds between sets.

## 2020-12-20 NOTE — Progress Notes (Signed)
New Patient Office Visit  Subjective:  Patient ID: Erica Snyder, female    DOB: [birth year]  Age: 60 y.o. MRN: [id number]  CC:  Chief Complaint  Patient presents with  . New Patient (Initial Visit)  . Foot Pain    Left heel; x3 months; no known injury; taking ibuprofen 600 mg twice daily, stretching, wearing ankle brace for stabilization; burning and soreness sensation; never had this type of location of pain before; left-handedness; 1-6/10 pain    HPI Erica Snyder presents for evaluation of 3 month history of left plantar fasciitis. Atraumatic onset after a period of relative ramp up in activity (increased walking). She has been aggressively addressing symptoms with initial scheduled NSAIDs, home exercises including stretching, and heel cup cushion and offloading plantar fascia brace. She has noted some progress with these measures however without resolution.  Past Medical History:  Diagnosis Date  . Asthma   . GERD (gastroesophageal reflux disease)   . Hormone replacement therapy (HRT)   . Hypertension   . Menopause   . Migraine   . Migraine   . Neck muscle spasm     Past Surgical History:  Procedure Laterality Date  . APPENDECTOMY    . COLONOSCOPY  2014   polyps  . LASIK    . WRIST FRACTURE SURGERY Right     Family History  Problem Relation Age of Onset  . Hypertension Mother   . Arthritis Mother   . Hypertension Father   . Heart disease Father   . Stroke Father   . Atrial fibrillation Father   . Hypertension Sister   . Ulcerative colitis Brother   . Asthma Son   . Migraines Son   . Bipolar disorder Maternal Grandmother   . Diabetes Mellitus II Maternal Grandmother   . COPD Maternal Grandfather   . Polycythemia Maternal Grandfather   . High Cholesterol Paternal Grandmother   . Rheum arthritis Paternal Grandfather   . Asthma Son   . Breast cancer Neg Hx     Social History   Socioeconomic History  . Marital status: Married    Spouse  name: Dawna Jakes  . Number of children: 2  . Years of education: 56+  . Highest education level: Doctorate  Occupational History  . Not on file  Tobacco Use  . Smoking status: Never Smoker  . Smokeless tobacco: Never Used  Vaping Use  . Vaping Use: Never used  Substance and Sexual Activity  . Alcohol use: Yes    Alcohol/week: 0.0 standard drinks  . Drug use: Never  . Sexual activity: Yes  Other Topics Concern  . Not on file  Social History Narrative  . Not on file   Social Determinants of Health   Financial Resource Strain: Not on file  Food Insecurity: Not on file  Transportation Needs: Not on file  Physical Activity: Not on file  Stress: Not on file  Social Connections: Not on file  Intimate Partner Violence: Not on file     Objective:   Today's Vitals: BP 118/68 (BP Location: Right Arm, Patient Position: Sitting, Cuff Size: Normal)   Pulse 71   Temp 98.1 F (36.7 C) (Oral)   Ht 5\' 2"  (1.575 m)   Wt 170 lb (77.1 kg)   SpO2 100%   BMI 31.09 kg/m   Physical Exam  Foot: No visible erythema or swelling. Range of motion is full in all directions. Strength is 5/5 in all directions. No hallux valgus. No pes cavus  or pes planus. No abnormal callus noted. Tenderness to palpation of the calcaneal insertion of plantar fascia. Neurovascularly intact distally.  Procedure:  Injection of left plantar fascia under ultrasound guidance Noted no overlying erythema, induration, or other signs of local infection. Skin prepped in a sterile fashion. Topical analgesic spray: Ethyl chloride. Completed without difficulty. Active medication:triamcinolone 40mg  / 60mL      Assessment & Plan:   Problem List Items Addressed This Visit      Musculoskeletal and Integument   Plantar fasciitis, left - Primary    Patient with longstanding history of plantar fasciitis despite aggressive attempts at conservative measures inclusive of medications, stretches, bracing, relative  rest.  As such, she did elect to proceed with ultrasound-guided plantar fascia injection, tolerated well, postinjection care outlined, plan to restart home exercise with additional information provided.  Can follow-up on as-needed basis.          Outpatient Encounter Medications as of 12/20/2020  Medication Sig  . albuterol (VENTOLIN HFA) 108 (90 Base) MCG/ACT inhaler Inhale 2 puffs into the lungs every 6 (six) hours as needed for wheezing or shortness of breath.  . butalbital-acetaminophen-caffeine (FIORICET) 50-325-40 MG tablet Take 1 tablet by mouth as needed.  . docusate sodium (COLACE) 100 MG capsule Take 100 mg by mouth daily.  . famotidine (PEPCID) 40 MG tablet TAKE 1 TABLET BY MOUTH DAILY  . fluticasone (FLONASE) 50 MCG/ACT nasal spray Place 2 sprays into both nostrils daily.  . hydrochlorothiazide (HYDRODIURIL) 12.5 MG tablet TAKE 1 TABLET BY MOUTH ONCE DAILY WITH LOSARTAN  . loratadine (CLARITIN) 10 MG tablet Take 10 mg by mouth daily.  Marland Kitchen losartan (COZAAR) 50 MG tablet TAKE 1 TABLET BY MOUTH ONCE DAILY WITH HCTZ  . Magnesium 400 MG CAPS Take 1 capsule by mouth daily.  . methocarbamol (ROBAXIN) 500 MG tablet TAKE 1 TABLET BY MOUTH FOUR TIMES DAILY  . rizatriptan (MAXALT) 10 MG tablet TAKE 1 TABLET BY MOUTH AS NEEDED FOR MIGRAINE. MAY REPEAT IN 2 HOURS IF NEEDED.  . Vitamin D, Cholecalciferol, 25 MCG (1000 UT) TABS Take 1 tablet by mouth daily.  . [EXPIRED] triamcinolone acetonide (KENALOG-40) injection 40 mg   . [DISCONTINUED] triamcinolone acetonide (KENALOG-40) injection 40 mg    No facility-administered encounter medications on file as of 12/20/2020.    Follow-up: Return if symptoms worsen or fail to improve.   Montel Culver, MD

## 2020-12-20 NOTE — Assessment & Plan Note (Signed)
Patient with longstanding history of plantar fasciitis despite aggressive attempts at conservative measures inclusive of medications, stretches, bracing, relative rest.  As such, she did elect to proceed with ultrasound-guided plantar fascia injection, tolerated well, postinjection care outlined, plan to restart home exercise with additional information provided.  Can follow-up on as-needed basis.

## 2021-01-03 ENCOUNTER — Other Ambulatory Visit: Payer: Self-pay

## 2021-01-03 MED ORDER — METHOCARBAMOL 500 MG PO TABS
1.0000 | ORAL_TABLET | Freq: Four times a day (QID) | ORAL | 1 refills | Status: DC
  Filled 2021-01-03: qty 100, 25d supply, fill #0

## 2021-02-21 ENCOUNTER — Other Ambulatory Visit: Payer: Self-pay

## 2021-02-21 MED ORDER — HYDROCHLOROTHIAZIDE 12.5 MG PO TABS
12.5000 mg | ORAL_TABLET | Freq: Every day | ORAL | 3 refills | Status: DC
  Filled 2021-02-21: qty 90, 90d supply, fill #0
  Filled 2021-05-22: qty 90, 90d supply, fill #1
  Filled 2021-08-15: qty 90, 90d supply, fill #2
  Filled 2021-11-15: qty 90, 90d supply, fill #3

## 2021-02-21 MED ORDER — FAMOTIDINE 40 MG PO TABS
40.0000 mg | ORAL_TABLET | Freq: Every day | ORAL | 3 refills | Status: DC
  Filled 2021-02-21: qty 90, 90d supply, fill #0
  Filled 2021-05-22: qty 90, 90d supply, fill #1
  Filled 2021-08-15: qty 90, 90d supply, fill #2
  Filled 2021-11-15: qty 90, 90d supply, fill #3

## 2021-02-21 MED ORDER — LOSARTAN POTASSIUM 50 MG PO TABS
50.0000 mg | ORAL_TABLET | Freq: Every day | ORAL | 3 refills | Status: DC
Start: 2021-02-21 — End: 2022-02-11
  Filled 2021-02-21: qty 90, 90d supply, fill #0
  Filled 2021-05-22: qty 90, 90d supply, fill #1
  Filled 2021-08-15: qty 90, 90d supply, fill #2
  Filled 2021-11-15: qty 90, 90d supply, fill #3

## 2021-04-20 ENCOUNTER — Other Ambulatory Visit: Payer: Self-pay

## 2021-04-20 DIAGNOSIS — Z0000 Encounter for general adult medical examination without abnormal findings: Secondary | ICD-10-CM

## 2021-04-21 DIAGNOSIS — Z0000 Encounter for general adult medical examination without abnormal findings: Secondary | ICD-10-CM | POA: Diagnosis not present

## 2021-04-21 LAB — BASIC METABOLIC PANEL
BUN: 19 (ref 4–21)
CO2: 26 — AB (ref 13–22)
Chloride: 97 — AB (ref 99–108)
Creatinine: 0.9 (ref 0.5–1.1)
Glucose: 84
Potassium: 4.1 (ref 3.4–5.3)
Sodium: 142 (ref 137–147)

## 2021-04-21 LAB — COMPREHENSIVE METABOLIC PANEL
Albumin: 5 (ref 3.5–5.0)
Calcium: 10.3 (ref 8.7–10.7)
GFR calc non Af Amer: 71
Globulin: 2.7

## 2021-04-21 LAB — CBC AND DIFFERENTIAL
HCT: 40 (ref 36–46)
Hemoglobin: 13.4 (ref 12.0–16.0)
Platelets: 257 (ref 150–399)
WBC: 6.3

## 2021-04-21 LAB — HEPATIC FUNCTION PANEL
ALT: 12 (ref 7–35)
AST: 16 (ref 13–35)
Alkaline Phosphatase: 109 (ref 25–125)

## 2021-04-21 LAB — LIPID PANEL
Cholesterol: 187 (ref 0–200)
HDL: 70 (ref 35–70)
LDL Cholesterol: 106
Triglycerides: 59 (ref 40–160)

## 2021-04-24 ENCOUNTER — Encounter: Payer: Self-pay | Admitting: Family Medicine

## 2021-04-25 LAB — COMPREHENSIVE METABOLIC PANEL
ALT: 12 IU/L (ref 0–32)
AST: 16 IU/L (ref 0–40)
Albumin/Globulin Ratio: 1.9 (ref 1.2–2.2)
Albumin: 5 g/dL — ABNORMAL HIGH (ref 3.8–4.8)
Alkaline Phosphatase: 109 IU/L (ref 44–121)
BUN/Creatinine Ratio: 21 (ref 12–28)
BUN: 19 mg/dL (ref 8–27)
Bilirubin Total: 0.3 mg/dL (ref 0.0–1.2)
CO2: 26 mmol/L (ref 20–29)
Calcium: 10.3 mg/dL (ref 8.7–10.3)
Chloride: 97 mmol/L (ref 96–106)
Creatinine, Ser: 0.92 mg/dL (ref 0.57–1.00)
Globulin, Total: 2.7 g/dL (ref 1.5–4.5)
Glucose: 84 mg/dL (ref 65–99)
Potassium: 4.1 mmol/L (ref 3.5–5.2)
Sodium: 142 mmol/L (ref 134–144)
Total Protein: 7.7 g/dL (ref 6.0–8.5)
eGFR: 71 mL/min/{1.73_m2} (ref >59)

## 2021-04-25 LAB — LIPID PANEL
Chol/HDL Ratio: 2.7 ratio (ref 0.0–4.4)
Cholesterol, Total: 187 mg/dL (ref 100–199)
HDL: 70 mg/dL (ref >39)
LDL Chol Calc (NIH): 106 mg/dL — ABNORMAL HIGH (ref 0–99)
Triglycerides: 59 mg/dL (ref 0–149)
VLDL Cholesterol Cal: 11 mg/dL (ref 5–40)

## 2021-04-25 LAB — CBC WITH DIFFERENTIAL/PLATELET
Basophils Absolute: 0 10*3/uL (ref 0.0–0.2)
Basos: 1 %
EOS (ABSOLUTE): 0.1 10*3/uL (ref 0.0–0.4)
Eos: 2 %
Hematocrit: 40.3 % (ref 34.0–46.6)
Hemoglobin: 13.4 g/dL (ref 11.1–15.9)
Immature Grans (Abs): 0 10*3/uL (ref 0.0–0.1)
Immature Granulocytes: 0 %
Lymphocytes Absolute: 1.9 10*3/uL (ref 0.7–3.1)
Lymphs: 30 %
MCH: 29.9 pg (ref 26.6–33.0)
MCHC: 33.3 g/dL (ref 31.5–35.7)
MCV: 90 fL (ref 79–97)
Monocytes Absolute: 0.5 10*3/uL (ref 0.1–0.9)
Monocytes: 8 %
Neutrophils Absolute: 3.7 10*3/uL (ref 1.4–7.0)
Neutrophils: 59 %
Platelets: 257 10*3/uL (ref 150–450)
RBC: 4.48 x10E6/uL (ref 3.77–5.28)
RDW: 12.6 % (ref 11.7–15.4)
WBC: 6.3 10*3/uL (ref 3.4–10.8)

## 2021-04-25 LAB — TSH+FREE T4
Free T4: 1.26 ng/dL (ref 0.82–1.77)
TSH: 1.64 u[IU]/mL (ref 0.450–4.500)

## 2021-04-25 LAB — CBC: RBC: 4.48 (ref 3.87–5.11)

## 2021-04-25 NOTE — Progress Notes (Signed)
Entered labs- waiting on thyroid

## 2021-05-12 ENCOUNTER — Other Ambulatory Visit: Payer: Self-pay

## 2021-05-12 ENCOUNTER — Encounter: Payer: Self-pay | Admitting: Family Medicine

## 2021-05-12 ENCOUNTER — Ambulatory Visit (INDEPENDENT_AMBULATORY_CARE_PROVIDER_SITE_OTHER): Payer: 59 | Admitting: Family Medicine

## 2021-05-12 VITALS — BP 134/72 | HR 68 | Ht 64.0 in | Wt 171.0 lb

## 2021-05-12 DIAGNOSIS — Z0000 Encounter for general adult medical examination without abnormal findings: Secondary | ICD-10-CM

## 2021-05-12 NOTE — Progress Notes (Signed)
Date:  05/12/2021   Name:  Erica Snyder   DOB:  1961/03/26   MRN:  [id number]   Chief Complaint: Annual Exam (No breast exam getting a mammo. )  Patient is a 60 year old female who presents for a comprehensive physical exam. The patient reports the following problems: none. Health maintenance has been reviewed upto date     Lab Results  Component Value Date   CREATININE 0.92 04/21/2021   BUN 19 04/21/2021   NA 142 04/21/2021   K 4.1 04/21/2021   CL 97 04/21/2021   CO2 26 04/21/2021   Lab Results  Component Value Date   CHOL 187 04/21/2021   HDL 70 04/21/2021   LDLCALC 106 (H) 04/21/2021   TRIG 59 04/21/2021   CHOLHDL 2.7 04/21/2021   Lab Results  Component Value Date   TSH 1.640 04/21/2021   No results found for: HGBA1C Lab Results  Component Value Date   WBC 6.3 04/21/2021   HGB 13.4 04/21/2021   HCT 40.3 04/21/2021   MCV 90 04/21/2021   PLT 257 04/21/2021   Lab Results  Component Value Date   ALT 12 04/21/2021   AST 16 04/21/2021   ALKPHOS 109 04/21/2021   BILITOT 0.3 04/21/2021     Review of Systems  Constitutional:  Negative for chills and fever.  HENT:  Negative for drooling, ear discharge, ear pain and sore throat.   Respiratory:  Negative for cough, shortness of breath and wheezing.   Cardiovascular:  Negative for chest pain, palpitations and leg swelling.  Gastrointestinal:  Negative for abdominal pain, blood in stool, constipation, diarrhea and nausea.  Endocrine: Negative for polydipsia.  Genitourinary:  Negative for dysuria, frequency, hematuria and urgency.  Musculoskeletal:  Negative for back pain, myalgias and neck pain.  Skin:  Negative for rash.  Allergic/Immunologic: Negative for environmental allergies.  Neurological:  Negative for dizziness and headaches.  Hematological:  Does not bruise/bleed easily.  Psychiatric/Behavioral:  Negative for suicidal ideas. The patient is not nervous/anxious.    Patient Active Problem List    Diagnosis Date Noted   Plantar fasciitis, left 12/20/2020   Palpitations 10/11/2018   GERD without esophagitis 01/31/2018   Hypertension 01/31/2018   Menopausal flushing 01/31/2018   Migraine headache 01/31/2018   Neck muscle spasm 01/31/2018   DDD (degenerative disc disease), lumbar 10/18/2017    Allergies  Allergen Reactions   Clarithromycin Nausea And Vomiting and Other (See Comments)    Stomach pain    Levofloxacin Other (See Comments)   Sulfa Antibiotics Rash    Past Surgical History:  Procedure Laterality Date   APPENDECTOMY     COLONOSCOPY  2014   polyps   LASIK     WRIST FRACTURE SURGERY Right     Social History   Tobacco Use   Smoking status: Never   Smokeless tobacco: Never  Vaping Use   Vaping Use: Never used  Substance Use Topics   Alcohol use: Yes    Alcohol/week: 0.0 standard drinks   Drug use: Never     Medication list has been reviewed and updated.  Current Meds  Medication Sig   albuterol (VENTOLIN HFA) 108 (90 Base) MCG/ACT inhaler Inhale 2 puffs into the lungs every 6 (six) hours as needed for wheezing or shortness of breath.   butalbital-acetaminophen-caffeine (FIORICET) 50-325-40 MG tablet Take 1 tablet by mouth as needed.   docusate sodium (COLACE) 100 MG capsule Take 100 mg by mouth daily.   famotidine (PEPCID)  40 MG tablet Take 1 tablet (40 mg total) by mouth daily.   fluticasone (FLONASE) 50 MCG/ACT nasal spray Place 2 sprays into both nostrils daily.   hydrochlorothiazide (HYDRODIURIL) 12.5 MG tablet Take 1 tablet (12.5 mg total) by mouth daily.   loratadine (CLARITIN) 10 MG tablet Take 10 mg by mouth daily.   losartan (COZAAR) 50 MG tablet Take 1 tablet (50 mg total) by mouth daily.   Magnesium 400 MG CAPS Take 1 capsule by mouth daily.   methocarbamol (ROBAXIN) 500 MG tablet Take 1 tablet (500 mg total) by mouth 4 (four) times daily.   rizatriptan (MAXALT) 10 MG tablet TAKE 1 TABLET BY MOUTH AS NEEDED FOR MIGRAINE. MAY REPEAT IN  2 HOURS IF NEEDED.   Vitamin D, Cholecalciferol, 25 MCG (1000 UT) TABS Take 1 tablet by mouth daily.    PHQ 2/9 Scores 05/12/2021 12/20/2020 04/29/2020 04/17/2019  PHQ - 2 Score 0 0 0 0  PHQ- 9 Score 0 0 0 0    GAD 7 : Generalized Anxiety Score 05/12/2021 12/20/2020 04/29/2020  Nervous, Anxious, on Edge 0 0 0  Control/stop worrying 0 0 0  Worry too much - different things 0 0 0  Trouble relaxing 0 0 0  Restless 0 0 0  Easily annoyed or irritable 0 0 0  Afraid - awful might happen 0 0 0  Total GAD 7 Score 0 0 0  Anxiety Difficulty - - Not difficult at all    BP Readings from Last 3 Encounters:  05/12/21 134/72  12/20/20 118/68  04/29/20 122/82    Physical Exam Vitals and nursing note reviewed.  Constitutional:      Appearance: She is well-developed.  HENT:     Head: Normocephalic.     Right Ear: Hearing, tympanic membrane, ear canal and external ear normal.     Left Ear: Hearing, tympanic membrane, ear canal and external ear normal.     Nose: Nose normal. No congestion or rhinorrhea.     Mouth/Throat:     Lips: Pink.     Mouth: Mucous membranes are moist.     Dentition: Normal dentition.     Tongue: No lesions.     Palate: No mass.     Pharynx: Oropharynx is clear.  Eyes:     General: Lids are everted, no foreign bodies appreciated. No scleral icterus.       Left eye: No foreign body or hordeolum.     Conjunctiva/sclera: Conjunctivae normal.     Right eye: Right conjunctiva is not injected.     Left eye: Left conjunctiva is not injected.     Pupils: Pupils are equal, round, and reactive to light.  Neck:     Thyroid: No thyromegaly.     Vascular: No JVD.     Trachea: No tracheal deviation.  Cardiovascular:     Rate and Rhythm: Normal rate and regular rhythm.     Chest Wall: PMI is not displaced. No thrill.     Pulses: Normal pulses. No decreased pulses.     Heart sounds: Normal heart sounds, S1 normal and S2 normal. No murmur heard. No systolic murmur is present.  No  diastolic murmur is present.    No friction rub. No gallop. No S3 or S4 sounds.  Pulmonary:     Effort: Pulmonary effort is normal. No respiratory distress.     Breath sounds: Normal breath sounds. No stridor. No decreased breath sounds, wheezing, rhonchi or rales.  Chest:  Chest wall: No tenderness.  Abdominal:     General: Bowel sounds are normal.     Palpations: Abdomen is soft. There is no hepatomegaly, splenomegaly or mass.     Tenderness: There is no abdominal tenderness. There is no guarding or rebound.     Hernia: No hernia is present. There is no hernia in the umbilical area or ventral area.  Musculoskeletal:        General: No tenderness. Normal range of motion.     Cervical back: Normal range of motion and neck supple.     Right lower leg: No edema.     Left lower leg: No edema.  Lymphadenopathy:     Head:     Right side of head: No submental, submandibular or tonsillar adenopathy.     Left side of head: No submental, submandibular or tonsillar adenopathy.     Cervical: No cervical adenopathy.     Right cervical: No superficial, deep or posterior cervical adenopathy.    Left cervical: No superficial, deep or posterior cervical adenopathy.  Skin:    General: Skin is warm.     Findings: No rash.  Neurological:     Mental Status: She is alert and oriented to person, place, and time.     Cranial Nerves: No cranial nerve deficit.  Psychiatric:        Mood and Affect: Mood is not anxious or depressed.    Wt Readings from Last 3 Encounters:  05/12/21 171 lb (77.6 kg)  12/20/20 170 lb (77.1 kg)  04/29/20 174 lb (78.9 kg)    BP 134/72   Pulse 68   Ht '5\' 4"'$  (1.626 m)   Wt 171 lb (77.6 kg)   SpO2 98%   BMI 29.35 kg/m   Assessment and Plan:  Erica Snyder is a 60 y.o. female who presents today for her Complete Annual Exam. She feels well. She reports exercising as tolerated. She reports she is sleeping well.  Chart review was reviewed from previous  encounters most recent labs most recent imaging as well as care everywhere. 1. Annual physical exam Immunizations are reviewed and recommendations provided.   Age appropriate screening tests are discussed. Counseling given for risk factor reduction interventions.  Review of previous labs that were done were unremarkable.  No subjective/objective concerns noted during history/review of systems/physical exam.

## 2021-05-24 ENCOUNTER — Other Ambulatory Visit: Payer: Self-pay

## 2021-08-15 ENCOUNTER — Other Ambulatory Visit: Payer: Self-pay

## 2021-08-22 ENCOUNTER — Other Ambulatory Visit: Payer: Self-pay

## 2021-08-22 MED ORDER — NYSTATIN-TRIAMCINOLONE 100000-0.1 UNIT/GM-% EX OINT
1.0000 "application " | TOPICAL_OINTMENT | Freq: Two times a day (BID) | CUTANEOUS | 0 refills | Status: DC
  Filled 2021-08-22: qty 30, 15d supply, fill #0

## 2021-08-24 ENCOUNTER — Other Ambulatory Visit: Payer: Self-pay | Admitting: Family Medicine

## 2021-08-24 DIAGNOSIS — Z1231 Encounter for screening mammogram for malignant neoplasm of breast: Secondary | ICD-10-CM

## 2021-08-25 ENCOUNTER — Other Ambulatory Visit: Payer: Self-pay

## 2021-09-13 ENCOUNTER — Ambulatory Visit
Admission: RE | Admit: 2021-09-13 | Discharge: 2021-09-13 | Disposition: A | Discharge status: Home / Self Care | Payer: 59 | Source: Ambulatory Visit | Attending: Family Medicine | Admitting: Family Medicine

## 2021-09-13 ENCOUNTER — Other Ambulatory Visit: Payer: Self-pay

## 2021-09-13 DIAGNOSIS — Z1231 Encounter for screening mammogram for malignant neoplasm of breast: Secondary | ICD-10-CM | POA: Insufficient documentation

## 2021-09-19 ENCOUNTER — Other Ambulatory Visit: Payer: Self-pay

## 2021-10-24 ENCOUNTER — Other Ambulatory Visit: Payer: Self-pay

## 2021-11-10 ENCOUNTER — Ambulatory Visit (INDEPENDENT_AMBULATORY_CARE_PROVIDER_SITE_OTHER): Payer: 59 | Admitting: Family Medicine

## 2021-11-10 ENCOUNTER — Encounter: Payer: Self-pay | Admitting: Family Medicine

## 2021-11-10 VITALS — BP 120/58 | HR 84 | Ht 64.0 in | Wt 174.0 lb

## 2021-11-10 DIAGNOSIS — K219 Gastro-esophageal reflux disease without esophagitis: Secondary | ICD-10-CM | POA: Diagnosis not present

## 2021-11-10 DIAGNOSIS — I10 Essential (primary) hypertension: Secondary | ICD-10-CM | POA: Diagnosis not present

## 2021-11-10 DIAGNOSIS — M62838 Other muscle spasm: Secondary | ICD-10-CM

## 2021-11-10 MED ORDER — METHOCARBAMOL 500 MG PO TABS: 500.0000 mg | ORAL_TABLET | Freq: Four times a day (QID) | ORAL | 1 refills | Status: DC

## 2021-11-10 MED ORDER — LOSARTAN POTASSIUM 50 MG PO TABS: 50.0000 mg | ORAL_TABLET | Freq: Every day | ORAL | 1 refills | Status: DC

## 2021-11-10 MED ORDER — HYDROCHLOROTHIAZIDE 12.5 MG PO TABS: 12.5000 mg | ORAL_TABLET | Freq: Every day | ORAL | 1 refills | Status: DC

## 2021-11-10 MED ORDER — FAMOTIDINE 40 MG PO TABS: 40.0000 mg | ORAL_TABLET | Freq: Every day | ORAL | 1 refills | Status: DC

## 2021-11-10 NOTE — Progress Notes (Signed)
Date:  11/10/2021   Name:  Erica Snyder   DOB:  19-Nov-1960   MRN:  [id number]   Chief Complaint: Hypertension, Gastroesophageal Reflux, and Neck Pain (Takes methacarbamol as needed for this)  Hypertension This is a chronic problem. The current episode started more than 1 year ago. The problem has been gradually improving since onset. The problem is controlled. Associated symptoms include neck pain. Pertinent negatives include no chest pain, malaise/fatigue, orthopnea, palpitations, peripheral edema, PND or shortness of breath. Past treatments include diuretics and angiotensin blockers. There are no compliance problems.   Gastroesophageal Reflux She reports no chest pain, no dysphagia or no heartburn. This is a chronic problem. The current episode started more than 1 year ago. The problem has been waxing and waning.  Neck Pain  This is a recurrent problem. The pain is present in the left side. The quality of the pain is described as aching. The pain is mild. Pertinent negatives include no chest pain. She has tried muscle relaxants (boswellia) for the symptoms.   Lab Results  Component Value Date   NA 142 04/21/2021   K 4.1 04/21/2021   CO2 26 04/21/2021   GLUCOSE 84 04/21/2021   BUN 19 04/21/2021   CREATININE 0.92 04/21/2021   CALCIUM 10.3 04/21/2021   EGFR 71 04/21/2021   GFRNONAA 71 04/21/2021   Lab Results  Component Value Date   CHOL 187 04/21/2021   HDL 70 04/21/2021   LDLCALC 106 (H) 04/21/2021   TRIG 59 04/21/2021   CHOLHDL 2.7 04/21/2021   Lab Results  Component Value Date   TSH 1.640 04/21/2021   No results found for: HGBA1C Lab Results  Component Value Date   WBC 6.3 04/21/2021   HGB 13.4 04/21/2021   HCT 40.3 04/21/2021   MCV 90 04/21/2021   PLT 257 04/21/2021   Lab Results  Component Value Date   ALT 12 04/21/2021   AST 16 04/21/2021   ALKPHOS 109 04/21/2021   BILITOT 0.3 04/21/2021   No results found for: 25OHVITD2, 25OHVITD3, VD25OH    Review of Systems  Constitutional:  Negative for malaise/fatigue.  Respiratory:  Negative for shortness of breath.   Cardiovascular:  Negative for chest pain, palpitations, orthopnea and PND.  Gastrointestinal:  Negative for dysphagia and heartburn.  Musculoskeletal:  Positive for neck pain.  All other systems reviewed and are negative.  Patient Active Problem List   Diagnosis Date Noted   Plantar fasciitis, left 12/20/2020   Palpitations 10/11/2018   GERD without esophagitis 01/31/2018   Hypertension 01/31/2018   Menopausal flushing 01/31/2018   Migraine headache 01/31/2018   Neck muscle spasm 01/31/2018   DDD (degenerative disc disease), lumbar 10/18/2017    Allergies  Allergen Reactions   Clarithromycin Nausea And Vomiting and Other (See Comments)    Stomach pain    Levofloxacin Other (See Comments)   Sulfa Antibiotics Rash    Past Surgical History:  Procedure Laterality Date   APPENDECTOMY     COLONOSCOPY  2014   polyps   LASIK     WRIST FRACTURE SURGERY Right     Social History   Tobacco Use   Smoking status: Never   Smokeless tobacco: Never  Vaping Use   Vaping Use: Never used  Substance Use Topics   Alcohol use: Yes    Alcohol/week: 0.0 standard drinks   Drug use: Never     Medication list has been reviewed and updated.  Current Meds  Medication Sig  albuterol (VENTOLIN HFA) 108 (90 Base) MCG/ACT inhaler Inhale 2 puffs into the lungs every 6 (six) hours as needed for wheezing or shortness of breath.   Boswellia Serrata (BOSWELLIA PO) Take 2 capsules by mouth daily.   butalbital-acetaminophen-caffeine (FIORICET) 50-325-40 MG tablet Take 1 tablet by mouth as needed.   docusate sodium (COLACE) 100 MG capsule Take 100 mg by mouth daily.   famotidine (PEPCID) 40 MG tablet Take 1 tablet (40 mg total) by mouth daily.   fluticasone (FLONASE) 50 MCG/ACT nasal spray Place 2 sprays into both nostrils daily.   hydrochlorothiazide (HYDRODIURIL) 12.5 MG  tablet Take 1 tablet (12.5 mg total) by mouth daily.   loratadine (CLARITIN) 10 MG tablet Take 10 mg by mouth daily.   losartan (COZAAR) 50 MG tablet Take 1 tablet (50 mg total) by mouth daily.   Magnesium 400 MG CAPS Take 1 capsule by mouth daily.   methocarbamol (ROBAXIN) 500 MG tablet Take 1 tablet (500 mg total) by mouth 4 (four) times daily.   nystatin-triamcinolone ointment (MYCOLOG) Apply 1 application topically 2 (two) times daily.   Vitamin D, Cholecalciferol, 25 MCG (1000 UT) TABS Take 1 tablet by mouth daily.    PHQ 2/9 Scores 11/10/2021 05/12/2021 12/20/2020 04/29/2020  PHQ - 2 Score 0 0 0 0  PHQ- 9 Score 0 0 0 0    GAD 7 : Generalized Anxiety Score 11/10/2021 05/12/2021 12/20/2020 04/29/2020  Nervous, Anxious, on Edge 0 0 0 0  Control/stop worrying 0 0 0 0  Worry too much - different things 0 0 0 0  Trouble relaxing 0 0 0 0  Restless 0 0 0 0  Easily annoyed or irritable 0 0 0 0  Afraid - awful might happen 0 0 0 0  Total GAD 7 Score 0 0 0 0  Anxiety Difficulty Not difficult at all - - Not difficult at all    BP Readings from Last 3 Encounters:  11/10/21 (!) 120/58  05/12/21 134/72  12/20/20 118/68    Physical Exam Vitals and nursing note reviewed. Exam conducted with a chaperone present.  Constitutional:      General: She is not in acute distress.    Appearance: She is not diaphoretic.  HENT:     Head: Normocephalic and atraumatic.     Right Ear: External ear normal.     Left Ear: External ear normal.     Nose: Nose normal.  Eyes:     General:        Right eye: No discharge.        Left eye: No discharge.     Conjunctiva/sclera: Conjunctivae normal.     Pupils: Pupils are equal, round, and reactive to light.  Neck:     Thyroid: No thyromegaly.     Vascular: No JVD.  Cardiovascular:     Rate and Rhythm: Normal rate and regular rhythm.     Pulses: Normal pulses.     Heart sounds: Normal heart sounds, S1 normal and S2 normal. No murmur heard. No systolic murmur  is present.  No diastolic murmur is present.    No friction rub. No gallop. No S3 or S4 sounds.  Pulmonary:     Effort: Pulmonary effort is normal.     Breath sounds: Normal breath sounds.  Abdominal:     General: Bowel sounds are normal.     Palpations: Abdomen is soft. There is no mass.     Tenderness: There is no abdominal tenderness. There is no  guarding.  Musculoskeletal:     Cervical back: Normal range of motion and neck supple.  Lymphadenopathy:     Cervical: No cervical adenopathy.  Skin:    General: Skin is warm and dry.  Neurological:     Mental Status: She is alert.     Deep Tendon Reflexes: Reflexes are normal and symmetric.    Wt Readings from Last 3 Encounters:  11/10/21 174 lb (78.9 kg)  05/12/21 171 lb (77.6 kg)  12/20/20 170 lb (77.1 kg)    BP (!) 120/58    Pulse 84    Ht '5\' 4"'  (1.626 m)    Wt 174 lb (78.9 kg)    BMI 29.87 kg/m   Assessment and Plan:  1. Primary hypertension Chronic.  Controlled.  Stable.  Blood pressure today is 120/58.  Continue losartan 50 mg once a day and hydrochlorothiazide 12.5 mg once a day.  Review of previous labs acceptable we will likely checking in 6 months. - hydrochlorothiazide (HYDRODIURIL) 12.5 MG tablet; Take 1 tablet (12.5 mg total) by mouth daily.  Dispense: 90 tablet; Refill: 1 - losartan (COZAAR) 50 MG tablet; Take 1 tablet (50 mg total) by mouth daily.  Dispense: 90 tablet; Refill: 1  2. GERD without esophagitis Chronic.  Controlled.  Stable.  Currently stable on Pepcid 40 mg once a day. - famotidine (PEPCID) 40 MG tablet; Take 1 tablet (40 mg total) by mouth daily.  Dispense: 90 tablet; Refill: 1  3. Neck muscle spasm Chronic.  Controlled.  Stable.  Is used primarily at night for neck spasm. - methocarbamol (ROBAXIN) 500 MG tablet; Take 1 tablet (500 mg total) by mouth 4 (four) times daily.  Dispense: 100 tablet; Refill: 1

## 2021-11-15 ENCOUNTER — Other Ambulatory Visit: Payer: Self-pay

## 2021-12-19 LAB — HEPATIC FUNCTION PANEL
ALT: 14 U/L (ref 7–35)
AST: 21 (ref 13–35)
Alkaline Phosphatase: 111 (ref 25–125)
Bilirubin, Total: 0.3

## 2021-12-19 LAB — LIPID PANEL
Cholesterol: 183 (ref 0–200)
HDL: 62 (ref 35–70)
LDL Cholesterol: 110
Triglycerides: 55 (ref 40–160)

## 2021-12-19 LAB — TSH: TSH: 1.5 (ref 0.41–5.90)

## 2021-12-19 LAB — COMPREHENSIVE METABOLIC PANEL
Albumin: 4.8 (ref 3.5–5.0)
Calcium: 10.2 (ref 8.7–10.7)
Globulin: 2.7
eGFR: 64

## 2021-12-19 LAB — HEMOGLOBIN A1C: Hemoglobin A1C: 5.7

## 2021-12-19 LAB — CBC AND DIFFERENTIAL
HCT: 40 (ref 36–46)
Hemoglobin: 13.5 (ref 12.0–16.0)
Platelets: 254 10*3/uL (ref 150–400)
WBC: 6.2

## 2021-12-19 LAB — CBC: RBC: 4.38 (ref 3.87–5.11)

## 2021-12-19 LAB — BASIC METABOLIC PANEL
BUN: 19 (ref 4–21)
Chloride: 98 — AB (ref 99–108)
Creatinine: 1 (ref 0.5–1.1)
Glucose: 94
Potassium: 4.2 mEq/L (ref 3.5–5.1)
Sodium: 138 (ref 137–147)

## 2022-02-11 ENCOUNTER — Other Ambulatory Visit: Payer: Self-pay | Admitting: Family Medicine

## 2022-02-14 ENCOUNTER — Other Ambulatory Visit: Payer: Self-pay

## 2022-02-14 MED ORDER — LOSARTAN POTASSIUM 50 MG PO TABS
50.0000 mg | ORAL_TABLET | Freq: Every day | ORAL | 1 refills | Status: DC
  Filled 2022-02-14: qty 90, 90d supply, fill #0
  Filled 2022-05-12: qty 90, 90d supply, fill #1

## 2022-02-14 MED ORDER — HYDROCHLOROTHIAZIDE 12.5 MG PO TABS
12.5000 mg | ORAL_TABLET | Freq: Every day | ORAL | 1 refills | Status: DC
  Filled 2022-02-14: qty 90, 90d supply, fill #0
  Filled 2022-05-12: qty 90, 90d supply, fill #1

## 2022-02-14 MED ORDER — FAMOTIDINE 40 MG PO TABS
40.0000 mg | ORAL_TABLET | Freq: Every day | ORAL | 1 refills | Status: DC
  Filled 2022-02-14: qty 90, 90d supply, fill #0
  Filled 2022-05-12: qty 90, 90d supply, fill #1

## 2022-02-15 ENCOUNTER — Other Ambulatory Visit: Payer: Self-pay

## 2022-02-15 DIAGNOSIS — R051 Acute cough: Secondary | ICD-10-CM

## 2022-02-15 MED ORDER — AZITHROMYCIN 250 MG PO TABS
ORAL_TABLET | ORAL | 0 refills | Status: AC
  Filled 2022-02-15: qty 6, 5d supply, fill #0

## 2022-02-15 MED ORDER — PROMETHAZINE-DM 6.25-15 MG/5ML PO SYRP
5.0000 mL | ORAL_SOLUTION | Freq: Four times a day (QID) | ORAL | 0 refills | Status: DC | PRN
  Filled 2022-02-15: qty 118, 6d supply, fill #0

## 2022-02-15 NOTE — Progress Notes (Signed)
Sent in zpack and cough syrup/ZPack was sent in on earlier chart. Cough syrup was added per Dr Merrilee Jansky and Dr Ronnald Ramp.

## 2022-05-12 ENCOUNTER — Other Ambulatory Visit: Payer: Self-pay

## 2022-05-15 ENCOUNTER — Encounter: Payer: Self-pay | Admitting: Family Medicine

## 2022-05-15 ENCOUNTER — Ambulatory Visit (INDEPENDENT_AMBULATORY_CARE_PROVIDER_SITE_OTHER): Payer: 59 | Admitting: Family Medicine

## 2022-05-15 VITALS — BP 120/62 | HR 72 | Ht 64.0 in | Wt 172.0 lb

## 2022-05-15 DIAGNOSIS — Z0000 Encounter for general adult medical examination without abnormal findings: Secondary | ICD-10-CM

## 2022-05-15 NOTE — Progress Notes (Signed)
Date:  05/15/2022   Name:  Erica Snyder   DOB:  Apr 09, 1961   MRN:  [id number]   Chief Complaint: Annual Exam  Patient is a 61 year old female who presents for a comprehensive physical exam. The patient reports the following problems: none. Health maintenance has been reviewed up to date,      Lab Results  Component Value Date   NA 138 12/19/2021   K 4.2 12/19/2021   CO2 26 04/21/2021   GLUCOSE 84 04/21/2021   BUN 19 12/19/2021   CREATININE 1.0 12/19/2021   CALCIUM 10.2 12/19/2021   EGFR 64 12/19/2021   GFRNONAA 71 04/21/2021   Lab Results  Component Value Date   CHOL 183 12/19/2021   HDL 62 12/19/2021   LDLCALC 110 12/19/2021   TRIG 55 12/19/2021   CHOLHDL 2.7 04/21/2021   Lab Results  Component Value Date   TSH 1.50 12/19/2021   Lab Results  Component Value Date   HGBA1C 5.7 12/19/2021   Lab Results  Component Value Date   WBC 6.2 12/19/2021   HGB 13.5 12/19/2021   HCT 40 12/19/2021   MCV 90 04/21/2021   PLT 254 12/19/2021   Lab Results  Component Value Date   ALT 14 12/19/2021   AST 21 12/19/2021   ALKPHOS 111 12/19/2021   BILITOT 0.3 04/21/2021   No results found for: "25OHVITD2", "25OHVITD3", "VD25OH"   Review of Systems  Constitutional:  Negative for fatigue and unexpected weight change.  HENT:  Negative for trouble swallowing.   Eyes:  Negative for visual disturbance.  Respiratory:  Negative for chest tightness and shortness of breath.   Cardiovascular:  Negative for chest pain and palpitations.  Gastrointestinal:  Negative for abdominal pain.  Endocrine: Negative for polydipsia and polyuria.  Genitourinary:  Negative for menstrual problem.  Neurological:  Negative for headaches.  Hematological:  Negative for adenopathy. Does not bruise/bleed easily.    Patient Active Problem List   Diagnosis Date Noted   Plantar fasciitis, left 12/20/2020   Palpitations 10/11/2018   GERD without esophagitis 01/31/2018   Hypertension  01/31/2018   Menopausal flushing 01/31/2018   Migraine headache 01/31/2018   Neck muscle spasm 01/31/2018   DDD (degenerative disc disease), lumbar 10/18/2017    Allergies  Allergen Reactions   Clarithromycin Nausea And Vomiting and Other (See Comments)    Stomach pain    Levofloxacin Other (See Comments)   Sulfa Antibiotics Rash    Past Surgical History:  Procedure Laterality Date   APPENDECTOMY     COLONOSCOPY  2014   polyps   LASIK     WRIST FRACTURE SURGERY Right     Social History   Tobacco Use   Smoking status: Never   Smokeless tobacco: Never  Vaping Use   Vaping Use: Never used  Substance Use Topics   Alcohol use: Yes    Alcohol/week: 0.0 standard drinks of alcohol   Drug use: Never     Medication list has been reviewed and updated.  Current Meds  Medication Sig   albuterol (VENTOLIN HFA) 108 (90 Base) MCG/ACT inhaler Inhale 2 puffs into the lungs every 6 (six) hours as needed for wheezing or shortness of breath.   Boswellia Serrata (BOSWELLIA PO) Take 2 capsules by mouth daily.   butalbital-acetaminophen-caffeine (FIORICET) 50-325-40 MG tablet Take 1 tablet by mouth as needed.   docusate sodium (COLACE) 100 MG capsule Take 100 mg by mouth daily.   famotidine (PEPCID) 40 MG tablet  Take 1 tablet (40 mg total) by mouth daily.   fluticasone (FLONASE) 50 MCG/ACT nasal spray Place 2 sprays into both nostrils daily.   hydrochlorothiazide (HYDRODIURIL) 12.5 MG tablet Take 1 tablet (12.5 mg total) by mouth daily.   loratadine (CLARITIN) 10 MG tablet Take 10 mg by mouth daily.   losartan (COZAAR) 50 MG tablet Take 1 tablet (50 mg total) by mouth daily.   Magnesium 400 MG CAPS Take 1 capsule by mouth daily.   methocarbamol (ROBAXIN) 500 MG tablet Take 1 tablet (500 mg total) by mouth 4 (four) times daily.   Vitamin D, Cholecalciferol, 25 MCG (1000 UT) TABS Take 1 tablet by mouth daily.   [DISCONTINUED] famotidine (PEPCID) 40 MG tablet Take 1 tablet (40 mg total)  by mouth daily.       05/15/2022   11:36 AM 11/10/2021    1:11 PM 05/12/2021    1:25 PM 12/20/2020    4:00 PM  GAD 7 : Generalized Anxiety Score  Nervous, Anxious, on Edge 0 0 0 0  Control/stop worrying 0 0 0 0  Worry too much - different things 0 0 0 0  Trouble relaxing 0 0 0 0  Restless 0 0 0 0  Easily annoyed or irritable 1 0 0 0  Afraid - awful might happen 0 0 0 0  Total GAD 7 Score 1 0 0 0  Anxiety Difficulty Not difficult at all Not difficult at all         05/15/2022   11:35 AM 11/10/2021    1:11 PM 05/12/2021    1:24 PM  Depression screen PHQ 2/9  Decreased Interest 0 0 0  Down, Depressed, Hopeless 0 0 0  PHQ - 2 Score 0 0 0  Altered sleeping 0 0 0  Tired, decreased energy 0 0 0  Change in appetite 0 0 0  Feeling bad or failure about yourself  0 0 0  Trouble concentrating 0 0 0  Moving slowly or fidgety/restless 0 0 0  Suicidal thoughts 0 0 0  PHQ-9 Score 0 0 0  Difficult doing work/chores Not difficult at all Not difficult at all Not difficult at all    BP Readings from Last 3 Encounters:  05/15/22 120/62  11/10/21 (!) 120/58  05/12/21 134/72    Physical Exam Vitals and nursing note reviewed.  Constitutional:      Appearance: She is well-developed.  HENT:     Head: Normocephalic.     Right Ear: Tympanic membrane and external ear normal.     Left Ear: Tympanic membrane and external ear normal.     Nose: Nose normal.     Mouth/Throat:     Mouth: Mucous membranes are moist.  Eyes:     General: Lids are everted, no foreign bodies appreciated. No scleral icterus.       Left eye: No foreign body or hordeolum.     Extraocular Movements: Extraocular movements intact.     Conjunctiva/sclera: Conjunctivae normal.     Right eye: Right conjunctiva is not injected.     Left eye: Left conjunctiva is not injected.     Pupils: Pupils are equal, round, and reactive to light.  Neck:     Thyroid: No thyromegaly.     Vascular: No JVD.     Trachea: No tracheal  deviation.  Cardiovascular:     Rate and Rhythm: Normal rate and regular rhythm.     Heart sounds: Normal heart sounds, S1 normal and S2 normal.  No murmur heard.    No systolic murmur is present.     No diastolic murmur is present.     No friction rub. No gallop. No S3 or S4 sounds.  Pulmonary:     Effort: Pulmonary effort is normal. No respiratory distress.     Breath sounds: Normal breath sounds. No decreased breath sounds, wheezing, rhonchi or rales.  Abdominal:     General: Bowel sounds are normal.     Palpations: Abdomen is soft. There is no mass.     Tenderness: There is no abdominal tenderness. There is no guarding or rebound.  Musculoskeletal:        General: No tenderness. Normal range of motion.     Cervical back: Normal range of motion and neck supple.     Right lower leg: No edema.     Left lower leg: No edema.  Lymphadenopathy:     Cervical: No cervical adenopathy.  Skin:    General: Skin is warm.     Findings: No bruising, erythema or rash.  Neurological:     Mental Status: She is alert and oriented to person, place, and time.     Cranial Nerves: Cranial nerves 2-12 are intact. No cranial nerve deficit.     Sensory: Sensation is intact. No sensory deficit.     Motor: Motor function is intact.     Deep Tendon Reflexes: Reflexes normal.     Reflex Scores:      Patellar reflexes are 2+ on the right side and 2+ on the left side. Psychiatric:        Mood and Affect: Mood is not anxious or depressed.     Wt Readings from Last 3 Encounters:  05/15/22 172 lb (78 kg)  11/10/21 174 lb (78.9 kg)  05/12/21 171 lb (77.6 kg)    BP 120/62   Pulse 72   Ht _0  (1.626 m)   Wt 172 lb (78 kg)   BMI 29.52 kg/m   Assessment and Plan:  1. Annual physical exam Ellese Julius is a 61 y.o. female who presents today for her Complete Annual Exam. She feels well. She reports exercising walks dog. She reports she is sleeping fairly well.  No subjective/objective concerns  noted during HPI, ROS or physical exam. - CBC w/Diff/Platelet - Lipid Panel With LDL/HDL Ratio - Comprehensive Metabolic Panel (CMET)    Otilio Miu, MD

## 2022-05-18 DIAGNOSIS — Z0000 Encounter for general adult medical examination without abnormal findings: Secondary | ICD-10-CM | POA: Diagnosis not present

## 2022-05-19 LAB — COMPREHENSIVE METABOLIC PANEL
ALT: 15 IU/L (ref 0–32)
AST: 18 IU/L (ref 0–40)
Albumin/Globulin Ratio: 1.9 (ref 1.2–2.2)
Albumin: 4.8 g/dL (ref 3.9–4.9)
Alkaline Phosphatase: 94 IU/L (ref 44–121)
BUN/Creatinine Ratio: 18 (ref 12–28)
BUN: 18 mg/dL (ref 8–27)
Bilirubin Total: 0.3 mg/dL (ref 0.0–1.2)
CO2: 26 mmol/L (ref 20–29)
Calcium: 10 mg/dL (ref 8.7–10.3)
Chloride: 98 mmol/L (ref 96–106)
Creatinine, Ser: 1.02 mg/dL — ABNORMAL HIGH (ref 0.57–1.00)
Globulin, Total: 2.5 g/dL (ref 1.5–4.5)
Glucose: 85 mg/dL (ref 70–99)
Potassium: 4.6 mmol/L (ref 3.5–5.2)
Sodium: 139 mmol/L (ref 134–144)
Total Protein: 7.3 g/dL (ref 6.0–8.5)
eGFR: 62 mL/min/{1.73_m2} (ref >59)

## 2022-05-19 LAB — CBC WITH DIFFERENTIAL/PLATELET
Basophils Absolute: 0 10*3/uL (ref 0.0–0.2)
Basos: 1 %
EOS (ABSOLUTE): 0.1 10*3/uL (ref 0.0–0.4)
Eos: 2 %
Hematocrit: 39.8 % (ref 34.0–46.6)
Hemoglobin: 13.4 g/dL (ref 11.1–15.9)
Immature Grans (Abs): 0 10*3/uL (ref 0.0–0.1)
Immature Granulocytes: 0 %
Lymphocytes Absolute: 1.9 10*3/uL (ref 0.7–3.1)
Lymphs: 32 %
MCH: 30.2 pg (ref 26.6–33.0)
MCHC: 33.7 g/dL (ref 31.5–35.7)
MCV: 90 fL (ref 79–97)
Monocytes Absolute: 0.4 10*3/uL (ref 0.1–0.9)
Monocytes: 7 %
Neutrophils Absolute: 3.4 10*3/uL (ref 1.4–7.0)
Neutrophils: 58 %
Platelets: 253 10*3/uL (ref 150–450)
RBC: 4.43 x10E6/uL (ref 3.77–5.28)
RDW: 12.8 % (ref 11.7–15.4)
WBC: 5.9 10*3/uL (ref 3.4–10.8)

## 2022-05-19 LAB — LIPID PANEL WITH LDL/HDL RATIO
Cholesterol, Total: 180 mg/dL (ref 100–199)
HDL: 60 mg/dL (ref >39)
LDL Chol Calc (NIH): 108 mg/dL — ABNORMAL HIGH (ref 0–99)
LDL/HDL Ratio: 1.8 ratio (ref 0.0–3.2)
Triglycerides: 64 mg/dL (ref 0–149)
VLDL Cholesterol Cal: 12 mg/dL (ref 5–40)

## 2022-06-01 DIAGNOSIS — Z872 Personal history of diseases of the skin and subcutaneous tissue: Secondary | ICD-10-CM | POA: Diagnosis not present

## 2022-06-01 DIAGNOSIS — D485 Neoplasm of uncertain behavior of skin: Secondary | ICD-10-CM | POA: Diagnosis not present

## 2022-06-01 DIAGNOSIS — L720 Epidermal cyst: Secondary | ICD-10-CM | POA: Diagnosis not present

## 2022-06-01 DIAGNOSIS — L578 Other skin changes due to chronic exposure to nonionizing radiation: Secondary | ICD-10-CM | POA: Diagnosis not present

## 2022-06-01 DIAGNOSIS — L298 Other pruritus: Secondary | ICD-10-CM | POA: Diagnosis not present

## 2022-06-01 DIAGNOSIS — L821 Other seborrheic keratosis: Secondary | ICD-10-CM | POA: Diagnosis not present

## 2022-06-01 DIAGNOSIS — Z86018 Personal history of other benign neoplasm: Secondary | ICD-10-CM | POA: Diagnosis not present

## 2022-06-29 DIAGNOSIS — D0371 Melanoma in situ of right lower limb, including hip: Secondary | ICD-10-CM | POA: Diagnosis not present

## 2022-06-29 DIAGNOSIS — L988 Other specified disorders of the skin and subcutaneous tissue: Secondary | ICD-10-CM | POA: Diagnosis not present

## 2022-08-06 ENCOUNTER — Other Ambulatory Visit: Payer: Self-pay

## 2022-08-06 ENCOUNTER — Other Ambulatory Visit: Payer: Self-pay | Admitting: Family Medicine

## 2022-08-07 ENCOUNTER — Other Ambulatory Visit: Payer: Self-pay

## 2022-08-07 MED FILL — Famotidine Tab 40 MG: ORAL | 90 days supply | Qty: 90 | Fill #0 | Status: AC

## 2022-08-07 MED FILL — Losartan Potassium Tab 50 MG: ORAL | 90 days supply | Qty: 90 | Fill #0 | Status: AC

## 2022-08-07 MED FILL — Hydrochlorothiazide Tab 12.5 MG: ORAL | 90 days supply | Qty: 90 | Fill #0 | Status: AC

## 2022-08-29 ENCOUNTER — Other Ambulatory Visit: Payer: Self-pay

## 2022-08-29 DIAGNOSIS — H109 Unspecified conjunctivitis: Secondary | ICD-10-CM

## 2022-08-29 MED ORDER — TOBRAMYCIN-DEXAMETHASONE 0.3-0.1 % OP SUSP
2.0000 [drp] | OPHTHALMIC | 0 refills | Status: DC
  Filled 2022-08-29: qty 5, 10d supply, fill #0

## 2022-09-04 ENCOUNTER — Other Ambulatory Visit: Payer: Self-pay | Admitting: Family Medicine

## 2022-09-04 DIAGNOSIS — Z1231 Encounter for screening mammogram for malignant neoplasm of breast: Secondary | ICD-10-CM

## 2022-10-05 ENCOUNTER — Ambulatory Visit: Payer: Commercial Managed Care - PPO

## 2022-10-18 ENCOUNTER — Other Ambulatory Visit: Payer: Self-pay

## 2022-10-18 DIAGNOSIS — M62838 Other muscle spasm: Secondary | ICD-10-CM

## 2022-10-18 MED ORDER — METHOCARBAMOL 500 MG PO TABS
500.0000 mg | ORAL_TABLET | Freq: Four times a day (QID) | ORAL | 1 refills | Status: DC
  Filled 2022-10-18: qty 100, 25d supply, fill #0
  Filled 2023-01-31 – 2023-05-01 (×2): qty 100, 25d supply, fill #1

## 2022-10-19 ENCOUNTER — Ambulatory Visit: Payer: Commercial Managed Care - PPO

## 2022-10-24 ENCOUNTER — Ambulatory Visit
Admission: RE | Admit: 2022-10-24 | Discharge: 2022-10-24 | Disposition: A | Discharge status: Home / Self Care | Payer: Commercial Managed Care - PPO | Source: Ambulatory Visit | Attending: Family Medicine | Admitting: Family Medicine

## 2022-10-24 DIAGNOSIS — Z1231 Encounter for screening mammogram for malignant neoplasm of breast: Secondary | ICD-10-CM | POA: Insufficient documentation

## 2022-11-06 MED FILL — Losartan Potassium Tab 50 MG: ORAL | 90 days supply | Qty: 90 | Fill #1 | Status: AC

## 2022-11-06 MED FILL — Famotidine Tab 40 MG: ORAL | 90 days supply | Qty: 90 | Fill #1 | Status: AC

## 2022-11-06 MED FILL — Hydrochlorothiazide Tab 12.5 MG: ORAL | 90 days supply | Qty: 90 | Fill #1 | Status: AC

## 2022-12-05 LAB — HEMOGLOBIN A1C: Hemoglobin A1C: 5.7

## 2022-12-05 LAB — BASIC METABOLIC PANEL
BUN: 22 — AB (ref 4–21)
CO2: 23 — AB (ref 13–22)
Chloride: 99 (ref 99–108)
Creatinine: 0.9 (ref 0.5–1.1)
Glucose: 84
Potassium: 4.6 mEq/L (ref 3.5–5.1)
Sodium: 139 (ref 137–147)

## 2022-12-05 LAB — COMPREHENSIVE METABOLIC PANEL
Albumin: 4.6 (ref 3.5–5.0)
Calcium: 9.7 (ref 8.7–10.7)
Globulin: 2.6
eGFR: 69

## 2022-12-05 LAB — CBC: RBC: 4.57 (ref 3.87–5.11)

## 2022-12-05 LAB — LIPID PANEL
Cholesterol: 187 (ref 0–200)
HDL: 62 (ref 35–70)
LDL Cholesterol: 114
Triglycerides: 60 (ref 40–160)

## 2022-12-05 LAB — HEPATIC FUNCTION PANEL
ALT: 12 U/L (ref 7–35)
AST: 21 (ref 13–35)
Alkaline Phosphatase: 98 (ref 25–125)
Bilirubin, Total: 0.2

## 2022-12-05 LAB — CBC AND DIFFERENTIAL
HCT: 41 (ref 36–46)
Hemoglobin: 13.6 (ref 12.0–16.0)
Platelets: 271 10*3/uL (ref 150–400)
WBC: 5.9

## 2022-12-05 LAB — TSH: TSH: 1.4 (ref 0.41–5.90)

## 2023-01-04 DIAGNOSIS — L578 Other skin changes due to chronic exposure to nonionizing radiation: Secondary | ICD-10-CM | POA: Diagnosis not present

## 2023-01-04 DIAGNOSIS — Z85820 Personal history of malignant melanoma of skin: Secondary | ICD-10-CM | POA: Diagnosis not present

## 2023-01-04 DIAGNOSIS — L570 Actinic keratosis: Secondary | ICD-10-CM | POA: Diagnosis not present

## 2023-01-04 DIAGNOSIS — Z872 Personal history of diseases of the skin and subcutaneous tissue: Secondary | ICD-10-CM | POA: Diagnosis not present

## 2023-01-04 DIAGNOSIS — Z86018 Personal history of other benign neoplasm: Secondary | ICD-10-CM | POA: Diagnosis not present

## 2023-01-31 ENCOUNTER — Other Ambulatory Visit: Payer: Self-pay | Admitting: Family Medicine

## 2023-01-31 NOTE — Telephone Encounter (Signed)
Requested medication (s) are due for refill today:   Yes for all 3  Requested medication (s) are on the active medication list:   Yes for all 3  Future visit scheduled:   No    Had physical 8 months ago  Looks like her is done yearly.   Last ordered: 08/07/2022 #90, 1 refill  Returned because 6 month labs are due to unable to refill per protocol.   From history this pt is seen yearly instead of every 6 months.  Provider to review for refills.       Requested Prescriptions  Pending Prescriptions Disp Refills   famotidine (PEPCID) 40 MG tablet 90 tablet 1    Sig: Take 1 tablet (40 mg total) by mouth daily.     Gastroenterology:  H2 Antagonists Passed - 01/31/2023 10:37 AM      Passed - Valid encounter within last 12 months    Recent Outpatient Visits           8 months ago Annual physical exam   [facility] [facility]   1 year ago Primary hypertension   [place] [facility]   1 year ago Annual physical exam   [facility]   2 years ago Plantar fasciitis, left   [place] [facility] Ocie Bob, MD   2 years ago Annual physical exam   [place] [facility], [facility]               hydrochlorothiazide (HYDRODIURIL) 12.5 MG tablet 90 tablet 1    Sig: Take 1 tablet (12.5 mg total) by mouth daily.     Cardiovascular: Diuretics - Thiazide Failed - 01/31/2023 10:37 AM      Failed - Cr in normal range and within 180 days    Creatinine, Ser  Date Value Ref Range Status  05/18/2022 1.02 (H) 0.57 - 1.00 mg/dL Final         Failed - K in normal range and within 180 days    Potassium  Date Value Ref Range Status  05/18/2022 4.6 3.5 - 5.2 mmol/L Final         Failed - Na in normal range and  within 180 days    Sodium  Date Value Ref Range Status  05/18/2022 139 134 - 144 mmol/L Final         Failed - Valid encounter within last 6 months    Recent Outpatient Visits           8 months ago Annual physical exam   [place] [facility]   1 year ago Primary hypertension   [place] [facility]   1 year ago Annual physical exam   [facility]   2 years ago Plantar fasciitis, left   [facility], Ocie Bob, MD   2 years ago Annual physical exam   [facility]  Passed - Last BP in normal range    BP Readings from Last 1 Encounters:  05/15/22 120/62          losartan (COZAAR) 50 MG tablet 90 tablet 1    Sig: Take 1 tablet (50 mg total) by mouth daily.     Cardiovascular:  Angiotensin Receptor Blockers Failed - 01/31/2023 10:37 AM      Failed - Cr in normal range and within 180 days    Creatinine, Ser  Date Value Ref Range Status  05/18/2022 1.02 (H) 0.57 - 1.00 mg/dL Final         Failed - K in normal range and within 180 days    Potassium  Date Value Ref Range Status  05/18/2022 4.6 3.5 - 5.2 mmol/L Final         Failed - Valid encounter within last 6 months    Recent Outpatient Visits           8 months ago Annual physical exam   [place] [facility]   1 year ago Primary hypertension   [place] [facility]   1 year ago Annual physical exam   [facility]   2 years ago Plantar fasciitis, left   [place] [facility] Ocie Bob, MD   2 years ago Annual physical exam   [facility], MD              Passed - Patient is not pregnant      Passed - Last BP in normal range    BP Readings from Last 1 Encounters:  05/15/22 120/62

## 2023-02-01 ENCOUNTER — Encounter: Payer: Self-pay | Admitting: Family Medicine

## 2023-02-01 ENCOUNTER — Other Ambulatory Visit: Payer: Self-pay

## 2023-02-01 ENCOUNTER — Ambulatory Visit: Payer: Commercial Managed Care - PPO | Admitting: Family Medicine

## 2023-02-01 VITALS — BP 110/68 | HR 68 | Ht 64.0 in | Wt 167.0 lb

## 2023-02-01 DIAGNOSIS — K219 Gastro-esophageal reflux disease without esophagitis: Secondary | ICD-10-CM | POA: Diagnosis not present

## 2023-02-01 DIAGNOSIS — I10 Essential (primary) hypertension: Secondary | ICD-10-CM | POA: Diagnosis not present

## 2023-02-01 DIAGNOSIS — G43009 Migraine without aura, not intractable, without status migrainosus: Secondary | ICD-10-CM | POA: Diagnosis not present

## 2023-02-01 MED ORDER — HYDROCHLOROTHIAZIDE 12.5 MG PO TABS
12.5000 mg | ORAL_TABLET | Freq: Every day | ORAL | 0 refills | Status: DC
  Filled 2023-02-01: qty 90, 90d supply, fill #0

## 2023-02-01 MED ORDER — LOSARTAN POTASSIUM 50 MG PO TABS
50.0000 mg | ORAL_TABLET | Freq: Every day | ORAL | 0 refills | Status: DC
Start: 2023-02-01 — End: 2023-05-01
  Filled 2023-02-01: qty 90, 90d supply, fill #0

## 2023-02-01 MED ORDER — FAMOTIDINE 40 MG PO TABS
40.0000 mg | ORAL_TABLET | Freq: Every day | ORAL | 0 refills | Status: DC
  Filled 2023-02-01: qty 90, 90d supply, fill #0

## 2023-02-01 MED ORDER — HYDROCHLOROTHIAZIDE 12.5 MG PO TABS
12.5000 mg | ORAL_TABLET | Freq: Every day | ORAL | 0 refills | Status: DC
Start: 2023-02-01 — End: 2023-05-01
  Filled 2023-02-01: qty 90, 90d supply, fill #0

## 2023-02-01 MED ORDER — LOSARTAN POTASSIUM 50 MG PO TABS
50.0000 mg | ORAL_TABLET | Freq: Every day | ORAL | 0 refills | Status: DC
  Filled 2023-02-01: qty 90, 90d supply, fill #0

## 2023-02-01 MED ORDER — FAMOTIDINE 40 MG PO TABS
40.0000 mg | ORAL_TABLET | Freq: Every day | ORAL | 0 refills | Status: DC
Start: 2023-02-01 — End: 2023-05-01
  Filled 2023-02-01: qty 90, 90d supply, fill #0

## 2023-02-01 NOTE — Progress Notes (Signed)
Date:  02/01/2023   Name:  Erica Snyder   DOB:  Oct 03, 1960   MRN:  [id number]   Chief Complaint: Hypertension, Hyperlipidemia, Migraine, and Allergic Rhinitis   Hypertension  Hyperlipidemia  Migraine  Her past medical history is significant for hypertension.    Lab Results  Component Value Date   NA 139 05/18/2022   K 4.6 05/18/2022   CO2 26 05/18/2022   GLUCOSE 85 05/18/2022   BUN 18 05/18/2022   CREATININE 1.02 (H) 05/18/2022   CALCIUM 10.0 05/18/2022   EGFR 62 05/18/2022   GFRNONAA 71 04/21/2021   Lab Results  Component Value Date   CHOL 180 05/18/2022   HDL 60 05/18/2022   LDLCALC 108 (H) 05/18/2022   TRIG 64 05/18/2022   CHOLHDL 2.7 04/21/2021   Lab Results  Component Value Date   TSH 1.50 12/19/2021   Lab Results  Component Value Date   HGBA1C 5.7 12/19/2021   Lab Results  Component Value Date   WBC 5.9 05/18/2022   HGB 13.4 05/18/2022   HCT 39.8 05/18/2022   MCV 90 05/18/2022   PLT 253 05/18/2022   Lab Results  Component Value Date   ALT 15 05/18/2022   AST 18 05/18/2022   ALKPHOS 94 05/18/2022   BILITOT 0.3 05/18/2022   No results found for: "25OHVITD2", "25OHVITD3", "VD25OH"   Review of Systems  Patient Active Problem List   Diagnosis Date Noted   Plantar fasciitis, left 12/20/2020   Palpitations 10/11/2018   GERD without esophagitis 01/31/2018   Hypertension 01/31/2018   Menopausal flushing 01/31/2018   Migraine headache 01/31/2018   Neck muscle spasm 01/31/2018   DDD (degenerative disc disease), lumbar 10/18/2017    Allergies  Allergen Reactions   Clarithromycin Nausea And Vomiting and Other (See Comments)    Stomach pain    Levofloxacin Other (See Comments)   Sulfa Antibiotics Rash    Past Surgical History:  Procedure Laterality Date   APPENDECTOMY     COLONOSCOPY  2014   polyps   LASIK     WRIST FRACTURE SURGERY Right     Social History   Tobacco Use   Smoking status: Never   Smokeless tobacco:  Never  Vaping Use   Vaping Use: Never used  Substance Use Topics   Alcohol use: Yes    Alcohol/week: 0.0 standard drinks of alcohol   Drug use: Never     Medication list has been reviewed and updated.  Current Meds  Medication Sig   Boswellia Serrata (BOSWELLIA PO) Take 2 capsules by mouth daily.   butalbital-acetaminophen-caffeine (FIORICET) 50-325-40 MG tablet Take 1 tablet by mouth as needed.   docusate sodium (COLACE) 100 MG capsule Take 100 mg by mouth daily.   famotidine (PEPCID) 40 MG tablet Take 1 tablet (40 mg total) by mouth daily.   fluticasone (FLONASE) 50 MCG/ACT nasal spray Place 2 sprays into both nostrils daily.   hydrochlorothiazide (HYDRODIURIL) 12.5 MG tablet Take 1 tablet (12.5 mg total) by mouth daily.   loratadine (CLARITIN) 10 MG tablet Take 10 mg by mouth daily.   losartan (COZAAR) 50 MG tablet Take 1 tablet (50 mg total) by mouth daily.   Magnesium 400 MG CAPS Take 1 capsule by mouth daily.   methocarbamol (ROBAXIN) 500 MG tablet Take 1 tablet (500 mg total) by mouth 4 (four) times daily.   nystatin-triamcinolone ointment (MYCOLOG) Apply 1 application topically 2 (two) times daily.   rizatriptan (MAXALT) 10 MG tablet TAKE 1 TABLET  BY MOUTH AS NEEDED FOR MIGRAINE. MAY REPEAT IN 2 HOURS IF NEEDED.   Vitamin D, Cholecalciferol, 25 MCG (1000 UT) TABS Take 1 tablet by mouth daily.       02/01/2023   11:28 AM 05/15/2022   11:36 AM 11/10/2021    1:11 PM 05/12/2021    1:25 PM  GAD 7 : Generalized Anxiety Score  Nervous, Anxious, on Edge 0 0 0 0  Control/stop worrying 0 0 0 0  Worry too much - different things 0 0 0 0  Trouble relaxing 0 0 0 0  Restless 0 0 0 0  Easily annoyed or irritable 0 1 0 0  Afraid - awful might happen 0 0 0 0  Total GAD 7 Score 0 1 0 0  Anxiety Difficulty Not difficult at all Not difficult at all Not difficult at all        02/01/2023   11:27 AM 05/15/2022   11:35 AM 11/10/2021    1:11 PM  Depression screen PHQ 2/9  Decreased  Interest 0 0 0  Down, Depressed, Hopeless 0 0 0  PHQ - 2 Score 0 0 0  Altered sleeping 0 0 0  Tired, decreased energy 0 0 0  Change in appetite 0 0 0  Feeling bad or failure about yourself  0 0 0  Trouble concentrating 0 0 0  Moving slowly or fidgety/restless 0 0 0  Suicidal thoughts 0 0 0  PHQ-9 Score 0 0 0  Difficult doing work/chores Not difficult at all Not difficult at all Not difficult at all    BP Readings from Last 3 Encounters:  02/01/23 110/68  05/15/22 120/62  11/10/21 (!) 120/58    Physical Exam Vitals and nursing note reviewed.  Constitutional:      Appearance: She is normal weight.  HENT:     Head: Normocephalic.     Right Ear: Tympanic membrane and ear canal normal.     Left Ear: Tympanic membrane and ear canal normal.     Mouth/Throat:     Mouth: Mucous membranes are moist.  Cardiovascular:     Rate and Rhythm: Normal rate and regular rhythm.     Heart sounds: Normal heart sounds. No murmur heard.    No friction rub. No gallop.  Pulmonary:     Breath sounds: No wheezing, rhonchi or rales.  Abdominal:     Palpations: There is no hepatomegaly or splenomegaly.     Tenderness: There is no abdominal tenderness.  Musculoskeletal:     Cervical back: Neck supple.  Neurological:     Mental Status: She is alert.     Wt Readings from Last 3 Encounters:  02/01/23 167 lb (75.8 kg)  05/15/22 172 lb (78 kg)  11/10/21 174 lb (78.9 kg)    BP 110/68   Pulse 68   Ht 5\' 4"  (1.626 m)   Wt 167 lb (75.8 kg)   SpO2 98%   BMI 28.67 kg/m   Assessment and Plan:  1. Primary hypertension Chronic.  Controlled.  Stable.  Blood pressure 110/68.  Continue hydrochlorothiazide 12.5 mg daily and losartan 50 mg daily.  Will recheck in 6 to 9 months. - hydrochlorothiazide (HYDRODIURIL) 12.5 MG tablet; Take 1 tablet (12.5 mg total) by mouth daily.  Dispense: 90 tablet; Refill: 0 - losartan (COZAAR) 50 MG tablet; Take 1 tablet (50 mg total) by mouth daily.  Dispense: 90  tablet; Refill: 0  2. Migraine without aura and without status migrainosus, not intractable .  Controlled.  Stable.  Migraines on an infrequent basis but controlled with Maxalt and Robaxin as needed.  Will recheck on an as-needed basis.  3. GERD without esophagitis Chronic.  Controlled.  Stable.  Controlled on Pepcid 40 mg 1 daily. - famotidine (PEPCID) 40 MG tablet; Take 1 tablet (40 mg total) by mouth daily.  Dispense: 90 tablet; Refill: 0    Elizabeth Sauer, MD

## 2023-02-02 ENCOUNTER — Other Ambulatory Visit (HOSPITAL_COMMUNITY): Payer: Self-pay

## 2023-02-02 ENCOUNTER — Encounter (HOSPITAL_COMMUNITY): Payer: Self-pay

## 2023-02-07 ENCOUNTER — Other Ambulatory Visit: Payer: Self-pay

## 2023-04-20 ENCOUNTER — Other Ambulatory Visit: Payer: Self-pay

## 2023-04-26 DIAGNOSIS — Z0100 Encounter for examination of eyes and vision without abnormal findings: Secondary | ICD-10-CM | POA: Diagnosis not present

## 2023-05-01 ENCOUNTER — Other Ambulatory Visit: Payer: Self-pay

## 2023-05-01 ENCOUNTER — Other Ambulatory Visit: Payer: Self-pay | Admitting: Family Medicine

## 2023-05-01 DIAGNOSIS — G43709 Chronic migraine without aura, not intractable, without status migrainosus: Secondary | ICD-10-CM

## 2023-05-01 DIAGNOSIS — I10 Essential (primary) hypertension: Secondary | ICD-10-CM

## 2023-05-01 DIAGNOSIS — K219 Gastro-esophageal reflux disease without esophagitis: Secondary | ICD-10-CM

## 2023-05-01 MED ORDER — LOSARTAN POTASSIUM 50 MG PO TABS
50.0000 mg | ORAL_TABLET | Freq: Every day | ORAL | 0 refills | Status: DC
Start: 2023-05-01 — End: 2023-07-31
  Filled 2023-05-01: qty 90, 90d supply, fill #0

## 2023-05-01 MED ORDER — HYDROCHLOROTHIAZIDE 12.5 MG PO TABS
12.5000 mg | ORAL_TABLET | Freq: Every day | ORAL | 0 refills | Status: DC
Start: 2023-05-01 — End: 2024-03-31
  Filled 2023-05-01: qty 90, 90d supply, fill #0

## 2023-05-01 MED ORDER — BUTALBITAL-APAP-CAFFEINE 50-325-40 MG PO TABS
1.0000 | ORAL_TABLET | ORAL | 0 refills | Status: DC | PRN
  Filled 2023-05-01: qty 90, 90d supply, fill #0

## 2023-05-01 MED ORDER — FAMOTIDINE 40 MG PO TABS
40.0000 mg | ORAL_TABLET | Freq: Every day | ORAL | 0 refills | Status: DC
Start: 2023-05-01 — End: 2023-07-31
  Filled 2023-05-01: qty 90, 90d supply, fill #0

## 2023-05-03 ENCOUNTER — Other Ambulatory Visit: Payer: Self-pay

## 2023-05-17 ENCOUNTER — Ambulatory Visit: Payer: Commercial Managed Care - PPO | Admitting: Family Medicine

## 2023-05-17 ENCOUNTER — Encounter: Payer: Self-pay | Admitting: Family Medicine

## 2023-05-17 VITALS — BP 100/62 | HR 74 | Ht 64.0 in | Wt 166.0 lb

## 2023-05-17 DIAGNOSIS — Z0000 Encounter for general adult medical examination without abnormal findings: Secondary | ICD-10-CM | POA: Diagnosis not present

## 2023-05-17 NOTE — Patient Instructions (Signed)

## 2023-05-17 NOTE — Progress Notes (Signed)
Date:  05/17/2023   Name:  Erica Snyder   DOB:  1961-07-19   MRN:  [id number]   Chief Complaint: Annual Exam  Patient is a 62 year old female who presents for a comprehensive physical exam. The patient reports the following problems: nocturnal cramps. Health maintenance has been reviewed up todate.      Lab Results  Component Value Date   NA 139 05/18/2022   K 4.6 05/18/2022   CO2 26 05/18/2022   GLUCOSE 85 05/18/2022   BUN 18 05/18/2022   CREATININE 1.02 (H) 05/18/2022   CALCIUM 10.0 05/18/2022   EGFR 62 05/18/2022   GFRNONAA 71 04/21/2021   Lab Results  Component Value Date   CHOL 180 05/18/2022   HDL 60 05/18/2022   LDLCALC 108 (H) 05/18/2022   TRIG 64 05/18/2022   CHOLHDL 2.7 04/21/2021   Lab Results  Component Value Date   TSH 1.50 12/19/2021   Lab Results  Component Value Date   HGBA1C 5.7 12/19/2021   Lab Results  Component Value Date   WBC 5.9 05/18/2022   HGB 13.4 05/18/2022   HCT 39.8 05/18/2022   MCV 90 05/18/2022   PLT 253 05/18/2022   Lab Results  Component Value Date   ALT 15 05/18/2022   AST 18 05/18/2022   ALKPHOS 94 05/18/2022   BILITOT 0.3 05/18/2022   No results found for: "25OHVITD2", "25OHVITD3", "VD25OH"   Review of Systems  Constitutional: Negative.   HENT: Negative.    Respiratory: Negative.    Cardiovascular: Negative.   Gastrointestinal: Negative.   Endocrine: Negative.   Genitourinary: Negative.   Musculoskeletal:  Positive for myalgias.  Neurological: Negative.   Hematological: Negative.     Patient Active Problem List   Diagnosis Date Noted   Plantar fasciitis, left 12/20/2020   Palpitations 10/11/2018   GERD without esophagitis 01/31/2018   Hypertension 01/31/2018   Menopausal flushing 01/31/2018   Migraine headache 01/31/2018   Neck muscle spasm 01/31/2018   DDD (degenerative disc disease), lumbar 10/18/2017    Allergies  Allergen Reactions   Clarithromycin Nausea And Vomiting and Other (See  Comments)    Stomach pain    Levofloxacin Other (See Comments)   Sulfa Antibiotics Rash    Past Surgical History:  Procedure Laterality Date   APPENDECTOMY     COLONOSCOPY  2014   polyps   LASIK     WRIST FRACTURE SURGERY Right     Social History   Tobacco Use   Smoking status: Never   Smokeless tobacco: Never  Vaping Use   Vaping status: Never Used  Substance Use Topics   Alcohol use: Yes    Alcohol/week: 0.0 standard drinks of alcohol   Drug use: Never     Medication list has been reviewed and updated.  Current Meds  Medication Sig   albuterol (VENTOLIN HFA) 108 (90 Base) MCG/ACT inhaler Inhale 2 puffs into the lungs every 6 (six) hours as needed for wheezing or shortness of breath.   Boswellia Serrata (BOSWELLIA PO) Take 2 capsules by mouth daily.   butalbital-acetaminophen-caffeine (FIORICET) 50-325-40 MG tablet Take 1 tablet by mouth as needed.   Calcium Carb-Cholecalciferol (CALCIUM 500 + D PO) 1 tablet daily at 6 (six) AM.   docusate sodium (COLACE) 100 MG capsule Take 100 mg by mouth daily.   famotidine (PEPCID) 40 MG tablet Take 1 tablet (40 mg total) by mouth daily.   fluticasone (FLONASE) 50 MCG/ACT nasal spray Place 2 sprays into  both nostrils daily.   hydrochlorothiazide (HYDRODIURIL) 12.5 MG tablet Take 1 tablet (12.5 mg total) by mouth daily.   loratadine (CLARITIN) 10 MG tablet Take 10 mg by mouth daily.   losartan (COZAAR) 50 MG tablet Take 1 tablet (50 mg total) by mouth daily.   Magnesium 400 MG CAPS Take 1 capsule by mouth daily.   methocarbamol (ROBAXIN) 500 MG tablet Take 1 tablet (500 mg total) by mouth 4 (four) times daily.   nystatin-triamcinolone ointment (MYCOLOG) Apply 1 application topically 2 (two) times daily.   rizatriptan (MAXALT) 10 MG tablet TAKE 1 TABLET BY MOUTH AS NEEDED FOR MIGRAINE. MAY REPEAT IN 2 HOURS IF NEEDED.   Vitamin D, Cholecalciferol, 25 MCG (1000 UT) TABS Take 1 tablet by mouth daily.       05/17/2023    1:19 PM  02/01/2023   11:28 AM 05/15/2022   11:36 AM 11/10/2021    1:11 PM  GAD 7 : Generalized Anxiety Score  Nervous, Anxious, on Edge 0 0 0 0  Control/stop worrying 0 0 0 0  Worry too much - different things 0 0 0 0  Trouble relaxing 0 0 0 0  Restless 0 0 0 0  Easily annoyed or irritable 0 0 1 0  Afraid - awful might happen 0 0 0 0  Total GAD 7 Score 0 0 1 0  Anxiety Difficulty Not difficult at all Not difficult at all Not difficult at all Not difficult at all       05/17/2023    1:19 PM 02/01/2023   11:27 AM 05/15/2022   11:35 AM  Depression screen PHQ 2/9  Decreased Interest 0 0 0  Down, Depressed, Hopeless 0 0 0  PHQ - 2 Score 0 0 0  Altered sleeping 0 0 0  Tired, decreased energy 0 0 0  Change in appetite 0 0 0  Feeling bad or failure about yourself  0 0 0  Trouble concentrating 0 0 0  Moving slowly or fidgety/restless 0 0 0  Suicidal thoughts 0 0 0  PHQ-9 Score 0 0 0  Difficult doing work/chores Not difficult at all Not difficult at all Not difficult at all    BP Readings from Last 3 Encounters:  05/17/23 100/62  02/01/23 110/68  05/15/22 120/62    Physical Exam Vitals and nursing note reviewed. Exam conducted with a chaperone present.  Constitutional:      General: She is not in acute distress.    Appearance: She is not diaphoretic.  HENT:     Head: Normocephalic and atraumatic.     Right Ear: Tympanic membrane and external ear normal.     Left Ear: Tympanic membrane and external ear normal.     Nose: Nose normal.     Mouth/Throat:     Mouth: Mucous membranes are moist.  Eyes:     General:        Right eye: No discharge.        Left eye: No discharge.     Conjunctiva/sclera: Conjunctivae normal.     Pupils: Pupils are equal, round, and reactive to light.  Neck:     Thyroid: No thyromegaly.     Vascular: No carotid bruit or JVD.  Cardiovascular:     Rate and Rhythm: Normal rate and regular rhythm.     Heart sounds: Normal heart sounds. No murmur heard.    No  friction rub. No gallop.  Pulmonary:     Effort: Pulmonary effort is normal.  Breath sounds: Normal breath sounds. No stridor. No wheezing or rales.  Chest:     Chest wall: No tenderness.  Abdominal:     General: Bowel sounds are normal.     Palpations: Abdomen is soft. There is no mass.     Tenderness: There is no abdominal tenderness. There is no guarding or rebound.  Musculoskeletal:        General: No swelling, tenderness, deformity or signs of injury. Normal range of motion.     Cervical back: Normal range of motion and neck supple.     Right lower leg: No edema.     Left lower leg: No edema.  Lymphadenopathy:     Cervical: No cervical adenopathy.  Skin:    General: Skin is warm and dry.     Findings: No bruising, erythema or lesion.  Neurological:     General: No focal deficit present.     Mental Status: She is alert.     Cranial Nerves: No cranial nerve deficit.     Sensory: No sensory deficit.     Motor: No weakness.     Deep Tendon Reflexes: Reflexes are normal and symmetric.     Wt Readings from Last 3 Encounters:  05/17/23 166 lb (75.3 kg)  02/01/23 167 lb (75.8 kg)  05/15/22 172 lb (78 kg)    BP 100/62   Pulse 74   Ht 5\' 4"  (1.626 m)   Wt 166 lb (75.3 kg)   SpO2 99%   BMI 28.49 kg/m   Assessment and Plan: 1. Annual physical exam No subjective/objective concerns noted during HPI, review of systems, or physical exam.  Review of previous labs obtained during early part of the year were reviewed and abstracted.  Will repeat in 1 year or on an as-needed basis.    Elizabeth Sauer, MD

## 2023-05-23 ENCOUNTER — Other Ambulatory Visit: Payer: Self-pay

## 2023-05-23 DIAGNOSIS — Z1211 Encounter for screening for malignant neoplasm of colon: Secondary | ICD-10-CM

## 2023-06-07 DIAGNOSIS — Z872 Personal history of diseases of the skin and subcutaneous tissue: Secondary | ICD-10-CM | POA: Diagnosis not present

## 2023-06-07 DIAGNOSIS — Z85820 Personal history of malignant melanoma of skin: Secondary | ICD-10-CM | POA: Diagnosis not present

## 2023-06-07 DIAGNOSIS — L578 Other skin changes due to chronic exposure to nonionizing radiation: Secondary | ICD-10-CM | POA: Diagnosis not present

## 2023-06-07 DIAGNOSIS — Z86018 Personal history of other benign neoplasm: Secondary | ICD-10-CM | POA: Diagnosis not present

## 2023-07-02 ENCOUNTER — Other Ambulatory Visit: Payer: Self-pay

## 2023-07-18 ENCOUNTER — Encounter: Payer: Self-pay | Admitting: Gastroenterology

## 2023-07-27 ENCOUNTER — Encounter: Payer: Self-pay | Admitting: Gastroenterology

## 2023-07-27 ENCOUNTER — Ambulatory Visit: Payer: Commercial Managed Care - PPO | Admitting: General Practice

## 2023-07-27 ENCOUNTER — Ambulatory Visit
Admission: RE | Admit: 2023-07-27 | Discharge: 2023-07-27 | Disposition: A | Discharge status: Home / Self Care | Payer: Commercial Managed Care - PPO | Attending: Gastroenterology | Admitting: Gastroenterology

## 2023-07-27 ENCOUNTER — Other Ambulatory Visit: Payer: Self-pay

## 2023-07-27 ENCOUNTER — Encounter
Admission: RE | Disposition: A | Discharge status: Home / Self Care | Payer: Self-pay | Source: Home / Self Care | Attending: Gastroenterology

## 2023-07-27 DIAGNOSIS — K219 Gastro-esophageal reflux disease without esophagitis: Secondary | ICD-10-CM | POA: Diagnosis not present

## 2023-07-27 DIAGNOSIS — Z860100 Personal history of colon polyps, unspecified: Secondary | ICD-10-CM

## 2023-07-27 DIAGNOSIS — K635 Polyp of colon: Secondary | ICD-10-CM

## 2023-07-27 DIAGNOSIS — J45909 Unspecified asthma, uncomplicated: Secondary | ICD-10-CM | POA: Diagnosis not present

## 2023-07-27 DIAGNOSIS — K640 First degree hemorrhoids: Secondary | ICD-10-CM | POA: Insufficient documentation

## 2023-07-27 DIAGNOSIS — I10 Essential (primary) hypertension: Secondary | ICD-10-CM | POA: Insufficient documentation

## 2023-07-27 DIAGNOSIS — K621 Rectal polyp: Secondary | ICD-10-CM | POA: Diagnosis not present

## 2023-07-27 DIAGNOSIS — Z860101 Personal history of adenomatous and serrated colon polyps: Secondary | ICD-10-CM | POA: Diagnosis not present

## 2023-07-27 DIAGNOSIS — Z1211 Encounter for screening for malignant neoplasm of colon: Secondary | ICD-10-CM | POA: Diagnosis not present

## 2023-07-27 HISTORY — PX: COLONOSCOPY WITH PROPOFOL: SHX5780

## 2023-07-27 HISTORY — PX: POLYPECTOMY: SHX5525

## 2023-07-27 SURGERY — COLONOSCOPY WITH PROPOFOL
Anesthesia: General | Site: Rectum

## 2023-07-27 MED ORDER — STERILE WATER FOR IRRIGATION IR SOLN
Status: DC | PRN
  Administered 2023-07-27: 1

## 2023-07-27 MED ORDER — LIDOCAINE HCL (CARDIAC) PF 100 MG/5ML IV SOSY
PREFILLED_SYRINGE | INTRAVENOUS | Status: DC | PRN
  Administered 2023-07-27: 40 mg via INTRAVENOUS

## 2023-07-27 MED ORDER — STERILE WATER FOR IRRIGATION IR SOLN
Status: DC | PRN
  Administered 2023-07-27: 60 mL

## 2023-07-27 MED ORDER — LIDOCAINE HCL (PF) 2 % IJ SOLN
INTRAMUSCULAR | Status: AC
  Filled 2023-07-27: qty 5

## 2023-07-27 MED ORDER — PROPOFOL 10 MG/ML IV BOLUS
INTRAVENOUS | Status: AC
Start: 2023-07-27 — End: ?
  Filled 2023-07-27: qty 40

## 2023-07-27 MED ORDER — SODIUM CHLORIDE 0.9 % IV SOLN: INTRAVENOUS | Status: DC

## 2023-07-27 MED ORDER — SODIUM CHLORIDE 0.9% FLUSH: 10.0000 mL | INTRAVENOUS | Status: DC | PRN

## 2023-07-27 MED ORDER — PROPOFOL 10 MG/ML IV BOLUS
INTRAVENOUS | Status: DC | PRN
  Administered 2023-07-27 (×2): 20 mg via INTRAVENOUS
  Administered 2023-07-27: 50 mg via INTRAVENOUS
  Administered 2023-07-27 (×2): 20 mg via INTRAVENOUS
  Administered 2023-07-27: 30 mg via INTRAVENOUS
  Administered 2023-07-27: 100 mg via INTRAVENOUS

## 2023-07-27 SURGICAL SUPPLY — 21 items

## 2023-07-27 NOTE — Op Note (Signed)
[facility] Patient Name: Erica Snyder Procedure Date: 07/27/2023 11:32 AM MRN: [id number] Account #: [id number] Date of Birth: Jan 30, 1961 Admit Type: Outpatient Age: 62 Room: [facility] OR ROOM 01 Gender: Female Note Status: Finalized Instrument Name: [id number] Procedure:             Colonoscopy Indications:           High risk colon cancer surveillance: Personal history                         of colonic polyps Providers:             Midge Minium MD, MD Referring MD:          Duanne Limerick, MD (Referring MD) Medicines:             Propofol per Anesthesia Complications:         No immediate complications. Procedure:             Pre-Anesthesia Assessment:                        - Prior to the procedure, a History and Physical was                         performed, and patient medications and allergies were                         reviewed. The patient's tolerance of previous                         anesthesia was also reviewed. The risks and benefits                         of the procedure and the sedation options and risks                         were discussed with the patient. All questions were                         answered, and informed consent was obtained. Prior                         Anticoagulants: The patient has taken no anticoagulant                         or antiplatelet agents. ASA Grade Assessment: II - A                         patient with mild systemic disease. After reviewing                         the risks and benefits, the patient was deemed in                         satisfactory condition to undergo the procedure.                        After obtaining informed consent, the colonoscope was  passed under direct vision. Throughout the procedure,                         the patient's blood pressure, pulse, and oxygen                         saturations were monitored continuously. The                          Colonoscope was introduced through the anus and                         advanced to the the cecum, identified by appendiceal                         orifice and ileocecal valve. The colonoscopy was                         performed without difficulty. The patient tolerated                         the procedure well. The quality of the bowel                         preparation was adequate. Findings:      The perianal and digital rectal examinations were normal.      A 3 mm polyp was found in the sigmoid colon. The polyp was sessile. The       polyp was removed with a cold snare. Resection and retrieval were       complete.      Non-bleeding internal hemorrhoids were found during retroflexion. The       hemorrhoids were Grade I (internal hemorrhoids that do not prolapse). Impression:            - One 3 mm polyp in the sigmoid colon, removed with a                         cold snare. Resected and retrieved.                        - Non-bleeding internal hemorrhoids. Recommendation:        - Discharge patient to home.                        - Resume previous diet.                        - Continue present medications.                        - Await pathology results.                        - Repeat colonoscopy in 7 years for surveillance. Procedure Code(s):     --- Professional ---                        [phone], Colonoscopy, flexible; with removal of  tumor(s), polyp(s), or other lesion(s) by snare                         technique Diagnosis Code(s):     --- Professional ---                        Z86.010, Personal history of colonic polyps                        D12.5, Benign neoplasm of sigmoid colon CPT copyright 2022 American Medical Association. All rights reserved. The codes documented in this report are preliminary and upon coder review may  be revised to meet current compliance requirements. Midge Minium MD, MD 07/27/2023 11:57:39 AM This report has  been signed electronically. Number of Addenda: 0 Note Initiated On: 07/27/2023 11:32 AM Scope Withdrawal Time: 0 hours 12 minutes 17 seconds  Total Procedure Duration: 0 hours 15 minutes 44 seconds  Estimated Blood Loss:  Estimated blood loss: none.      [facility]

## 2023-07-27 NOTE — H&P (Signed)
Midge Minium, MD [facility] [address], [address] [place], [place] [address] Phone:[phone] Fax : [phone]  Primary Care Physician:  Duanne Limerick, MD Primary Gastroenterologist:  Dr. Servando Snare  Pre-Procedure History & Physical: HPI:  Erica Snyder is a 62 y.o. female is here for an colonoscopy.   Past Medical History:  Diagnosis Date   Asthma    GERD (gastroesophageal reflux disease)    Hormone replacement therapy (HRT)    Hypertension    Menopause    Migraine    Migraine    Neck muscle spasm     Past Surgical History:  Procedure Laterality Date   APPENDECTOMY     COLONOSCOPY  2014   polyps   LASIK     WRIST FRACTURE SURGERY Right     Prior to Admission medications   Medication Sig Start Date End Date Taking? Authorizing Provider  albuterol (VENTOLIN HFA) 108 (90 Base) MCG/ACT inhaler Inhale 2 puffs into the lungs every 6 (six) hours as needed for wheezing or shortness of breath. 04/17/19  Yes Duanne Limerick, MD  Boswellia Serrata (BOSWELLIA PO) Take 2 capsules by mouth daily.   Yes [provider]  butalbital-acetaminophen-caffeine (FIORICET) 50-325-40 MG tablet Take 1 tablet by mouth as needed. 05/01/23  Yes Duanne Limerick, MD  Calcium Carb-Cholecalciferol (CALCIUM 500 + D PO) 1 tablet daily at 6 (six) AM.   Yes [provider]  docusate sodium (COLACE) 100 MG capsule Take 100 mg by mouth daily.   Yes [provider]  famotidine (PEPCID) 40 MG tablet Take 1 tablet (40 mg total) by mouth daily. 05/01/23 04/30/24 Yes Duanne Limerick, MD  fluticasone (FLONASE) 50 MCG/ACT nasal spray Place 2 sprays into both nostrils daily.   Yes [provider]  loratadine (CLARITIN) 10 MG tablet Take 10 mg by mouth daily.   Yes [provider]  losartan (COZAAR) 50 MG tablet Take 1 tablet (50 mg total) by mouth daily. 05/01/23 04/30/24 Yes Duanne Limerick, MD  Magnesium 400 MG CAPS Take 1 capsule by mouth daily.   Yes [provider]  methocarbamol (ROBAXIN) 500 MG tablet Take 1 tablet (500 mg total) by mouth 4 (four) times daily. 10/18/22  Yes Duanne Limerick, MD  nystatin-triamcinolone ointment [facility]) Apply 1 application topically 2 (two) times daily. 08/22/21  Yes Duanne Limerick, MD  rizatriptan (MAXALT) 10 MG tablet TAKE 1 TABLET BY MOUTH AS NEEDED FOR MIGRAINE. MAY REPEAT IN 2 HOURS IF NEEDED. 01/29/17  Yes Duanne Limerick, MD  Vitamin D, Cholecalciferol, 25 MCG (1000 UT) TABS Take 1 tablet by mouth daily.   Yes [provider]  hydrochlorothiazide (HYDRODIURIL) 12.5 MG tablet Take 1 tablet (12.5 mg total) by mouth daily. Patient not taking: Reported on 07/18/2023 05/01/23 04/30/24  Duanne Limerick, MD    Allergies as of 05/23/2023 - Review Complete 05/17/2023  Allergen Reaction Noted   Clarithromycin Nausea And Vomiting and Other (See Comments) 10/18/2017   Levofloxacin Other (See Comments) 10/18/2017   Sulfa antibiotics Rash 09/07/2015    Family History  Problem Relation Age of Onset   Hypertension Mother    Arthritis Mother    Hypertension Father    Heart disease Father    Stroke Father    Atrial fibrillation Father    Hypertension Sister    Ulcerative colitis Brother    Asthma Son    Migraines Son    Bipolar disorder Maternal Grandmother    Diabetes Mellitus II  Maternal Grandmother    COPD Maternal Grandfather    Polycythemia Maternal Grandfather    High Cholesterol Paternal Grandmother    Rheum arthritis Paternal Grandfather    Asthma Son    Breast cancer Neg Hx     Social History   Socioeconomic History   Marital status: Married    Spouse name: Katilaya Fross   Number of children: 2   Years of education: 20+   Highest education level: Doctorate  Occupational History   Not on file  Tobacco Use   Smoking status: Never   Smokeless tobacco: Never  Vaping Use   Vaping status: Never Used  Substance and Sexual Activity   Alcohol use: Yes    Alcohol/week: 0.0  standard drinks of alcohol   Drug use: Never   Sexual activity: Yes  Other Topics Concern   Not on file  Social History Narrative   Not on file   Social Determinants of Health   Financial Resource Strain: Not on file  Food Insecurity: Not on file  Transportation Needs: Not on file  Physical Activity: Not on file  Stress: Not on file  Social Connections: Not on file  Intimate Partner Violence: Not on file    Review of Systems: See HPI, otherwise negative ROS  Physical Exam: BP (!) 142/81   Pulse 75   Temp 98.3 F (36.8 C) (Temporal)   Resp 18   Ht 5\' 4"  (1.626 m)   Wt 76.7 kg   SpO2 100%   BMI 29.01 kg/m  General:   Alert,  pleasant and cooperative in NAD Head:  Normocephalic and atraumatic. Neck:  Supple; no masses or thyromegaly. Lungs:  Clear throughout to auscultation.    Heart:  Regular rate and rhythm. Abdomen:  Soft, nontender and nondistended. Normal bowel sounds, without guarding, and without rebound.   Neurologic:  Alert and  oriented x4;  grossly normal neurologically.  Impression/Plan: Erica Snyder is here for an colonoscopy to be performed for a history of adenomatous polyps on 2019   Risks, benefits, limitations, and alternatives regarding  colonoscopy have been reviewed with the patient.  Questions have been answered.  All parties agreeable.   Midge Minium, MD  07/27/2023, 10:35 AM

## 2023-07-27 NOTE — Anesthesia Postprocedure Evaluation (Signed)
Anesthesia Post Note  Patient: Erica Snyder  Procedure(s) Performed: COLONOSCOPY WITH PROPOFOL (Rectum) POLYPECTOMY (Rectum)  Patient location during evaluation: PACU Anesthesia Type: General Level of consciousness: awake and alert Pain management: pain level controlled Vital Signs Assessment: post-procedure vital signs reviewed and stable Respiratory status: spontaneous breathing, nonlabored ventilation, respiratory function stable and patient connected to nasal cannula oxygen Cardiovascular status: blood pressure returned to baseline and stable Postop Assessment: no apparent nausea or vomiting Anesthetic complications: no   No notable events documented.   Last Vitals:  Vitals:   07/27/23 1211 07/27/23 1215  BP: 129/83 130/71  Pulse: 69 66  Resp: 15 15  Temp:  (!) 36.2 C  SpO2: 98% 100%    Last Pain:  Vitals:   07/27/23 1215  TempSrc:   PainSc: 0-No pain                 Alene Bergerson C Aarian Cleaver

## 2023-07-27 NOTE — Transfer of Care (Signed)
Immediate Anesthesia Transfer of Care Note  Patient: Erica Snyder  Procedure(s) Performed: COLONOSCOPY WITH PROPOFOL (Rectum)  Patient Location: PACU  Anesthesia Type: General  Level of Consciousness: awake, alert  and patient cooperative  Airway and Oxygen Therapy: Patient Spontanous Breathing and Patient connected to supplemental oxygen  Post-op Assessment: Post-op Vital signs reviewed, Patient's Cardiovascular Status Stable, Respiratory Function Stable, Patent Airway and No signs of Nausea or vomiting  Post-op Vital Signs: Reviewed and stable  Complications: No notable events documented.

## 2023-07-27 NOTE — Anesthesia Preprocedure Evaluation (Signed)
Anesthesia Evaluation  Patient identified by MRN, date of birth, ID band Patient awake    Reviewed: Allergy & Precautions, H&P , NPO status , Patient's Chart, lab work & pertinent test results  Airway Mallampati: II  TM Distance: <3 FB Neck ROM: Full    Dental no notable dental hx.    Pulmonary asthma    Pulmonary exam normal breath sounds clear to auscultation       Cardiovascular hypertension, negative cardio ROS Normal cardiovascular exam Rhythm:Regular Rate:Normal     Neuro/Psych  Headaches  Neuromuscular disease  negative psych ROS   GI/Hepatic negative GI ROS, Neg liver ROS,,,  Endo/Other  negative endocrine ROS    Renal/GU negative Renal ROS  negative genitourinary   Musculoskeletal negative musculoskeletal ROS (+)    Abdominal   Peds negative pediatric ROS (+)  Hematology negative hematology ROS (+)   Anesthesia Other Findings Hypertension  Migraine Hormone replacement therapy (HRT) Migraine Neck muscle spasm  GERD (gastroesophageal reflux disease) Menopause  Asthma  Woke up during last colonoscopy, but was ok with that    Reproductive/Obstetrics negative OB ROS                              Anesthesia Physical Anesthesia Plan  ASA: 2  Anesthesia Plan: General   Post-op Pain Management:    Induction: Intravenous  PONV Risk Score and Plan:   Airway Management Planned: Natural Airway and Nasal Cannula  Additional Equipment:   Intra-op Plan:   Post-operative Plan:   Informed Consent: I have reviewed the patients History and Physical, chart, labs and discussed the procedure including the risks, benefits and alternatives for the proposed anesthesia with the patient or authorized representative who has indicated his/her understanding and acceptance.     Dental Advisory Given  Plan Discussed with: Anesthesiologist, CRNA and Surgeon  Anesthesia Plan Comments:  (Patient consented for risks of anesthesia including but not limited to:  - adverse reactions to medications - risk of airway placement if required - damage to eyes, teeth, lips or other oral mucosa - nerve damage due to positioning  - sore throat or hoarseness - Damage to heart, brain, nerves, lungs, other parts of body or loss of life  Patient voiced understanding and assent.)         Anesthesia Quick Evaluation

## 2023-07-28 ENCOUNTER — Encounter: Payer: Self-pay | Admitting: Gastroenterology

## 2023-07-30 ENCOUNTER — Other Ambulatory Visit: Payer: Self-pay | Admitting: Family Medicine

## 2023-07-30 DIAGNOSIS — I10 Essential (primary) hypertension: Secondary | ICD-10-CM

## 2023-07-30 DIAGNOSIS — K219 Gastro-esophageal reflux disease without esophagitis: Secondary | ICD-10-CM

## 2023-07-31 ENCOUNTER — Other Ambulatory Visit: Payer: Self-pay | Admitting: Family Medicine

## 2023-07-31 ENCOUNTER — Other Ambulatory Visit: Payer: Self-pay

## 2023-07-31 DIAGNOSIS — I10 Essential (primary) hypertension: Secondary | ICD-10-CM

## 2023-07-31 DIAGNOSIS — K219 Gastro-esophageal reflux disease without esophagitis: Secondary | ICD-10-CM

## 2023-07-31 MED FILL — Losartan Potassium Tab 50 MG: ORAL | 30 days supply | Qty: 30 | Fill #0 | Status: AC

## 2023-07-31 MED FILL — Losartan Potassium Tab 50 MG: ORAL | 60 days supply | Qty: 60 | Fill #0 | Status: AC

## 2023-07-31 MED FILL — Famotidine Tab 40 MG: ORAL | 90 days supply | Qty: 90 | Fill #0 | Status: AC

## 2023-08-06 ENCOUNTER — Encounter: Payer: Self-pay | Admitting: Gastroenterology

## 2023-08-06 LAB — SURGICAL PATHOLOGY

## 2023-09-10 ENCOUNTER — Other Ambulatory Visit: Payer: Self-pay | Admitting: Family Medicine

## 2023-09-10 DIAGNOSIS — Z1231 Encounter for screening mammogram for malignant neoplasm of breast: Secondary | ICD-10-CM

## 2023-10-01 ENCOUNTER — Other Ambulatory Visit: Payer: Self-pay | Admitting: Family Medicine

## 2023-10-01 MED ORDER — AMOXICILLIN-POT CLAVULANATE 875-125 MG PO TABS: 1.0000 | ORAL_TABLET | Freq: Two times a day (BID) | ORAL | 0 refills | Status: DC

## 2023-10-26 ENCOUNTER — Other Ambulatory Visit: Payer: Self-pay

## 2023-10-26 ENCOUNTER — Other Ambulatory Visit: Payer: Self-pay | Admitting: Family Medicine

## 2023-10-26 DIAGNOSIS — I10 Essential (primary) hypertension: Secondary | ICD-10-CM

## 2023-10-26 DIAGNOSIS — K219 Gastro-esophageal reflux disease without esophagitis: Secondary | ICD-10-CM

## 2023-10-26 MED ORDER — FAMOTIDINE 40 MG PO TABS
40.0000 mg | ORAL_TABLET | Freq: Every day | ORAL | 2 refills | Status: DC
  Filled 2023-10-26: qty 90, 90d supply, fill #0
  Filled 2024-02-04: qty 90, 90d supply, fill #1
  Filled 2024-05-02: qty 90, 90d supply, fill #2

## 2023-10-26 MED ORDER — LOSARTAN POTASSIUM 50 MG PO TABS
50.0000 mg | ORAL_TABLET | Freq: Every day | ORAL | 2 refills | Status: DC
  Filled 2023-10-26: qty 90, 90d supply, fill #0
  Filled 2024-02-04: qty 90, 90d supply, fill #1
  Filled 2024-05-02: qty 90, 90d supply, fill #2

## 2023-10-29 ENCOUNTER — Other Ambulatory Visit: Payer: Self-pay | Admitting: Family Medicine

## 2023-10-29 ENCOUNTER — Other Ambulatory Visit: Payer: Self-pay

## 2023-10-29 DIAGNOSIS — J4520 Mild intermittent asthma, uncomplicated: Secondary | ICD-10-CM

## 2023-10-29 MED ORDER — ALBUTEROL SULFATE HFA 108 (90 BASE) MCG/ACT IN AERS
2.0000 | INHALATION_SPRAY | Freq: Four times a day (QID) | RESPIRATORY_TRACT | 3 refills | Status: AC | PRN
  Filled 2023-10-29: qty 18, 30d supply, fill #0
  Filled 2024-04-21: qty 18, 30d supply, fill #1
  Filled 2024-10-19: qty 18, 30d supply, fill #2

## 2023-11-01 ENCOUNTER — Ambulatory Visit
Admission: RE | Admit: 2023-11-01 | Discharge: 2023-11-01 | Disposition: A | Discharge status: Home / Self Care | Payer: Commercial Managed Care - PPO | Source: Ambulatory Visit | Attending: Family Medicine | Admitting: Family Medicine

## 2023-11-01 DIAGNOSIS — Z1231 Encounter for screening mammogram for malignant neoplasm of breast: Secondary | ICD-10-CM | POA: Insufficient documentation

## 2023-11-08 ENCOUNTER — Ambulatory Visit: Payer: Commercial Managed Care - PPO | Admitting: Family Medicine

## 2023-11-08 ENCOUNTER — Other Ambulatory Visit: Payer: Self-pay

## 2023-11-08 ENCOUNTER — Encounter: Payer: Self-pay | Admitting: Family Medicine

## 2023-11-08 VITALS — BP 128/72 | HR 76 | Temp 98.1°F | Ht 64.0 in | Wt 169.0 lb

## 2023-11-08 DIAGNOSIS — J0100 Acute maxillary sinusitis, unspecified: Secondary | ICD-10-CM

## 2023-11-08 DIAGNOSIS — J018 Other acute sinusitis: Secondary | ICD-10-CM

## 2023-11-08 LAB — POCT INFLUENZA A/B
Influenza A, POC: NEGATIVE
Influenza B, POC: NEGATIVE

## 2023-11-08 LAB — POC COVID19 BINAXNOW: SARS Coronavirus 2 Ag: NEGATIVE

## 2023-11-08 MED ORDER — CEFUROXIME AXETIL 500 MG PO TABS: 500.0000 mg | ORAL_TABLET | Freq: Two times a day (BID) | ORAL | 0 refills | Status: DC

## 2023-11-08 MED ORDER — CEFUROXIME AXETIL 500 MG PO TABS: 500.0000 mg | ORAL_TABLET | Freq: Two times a day (BID) | ORAL | 0 refills | Status: AC

## 2023-11-08 MED ORDER — PREDNISONE 10 MG PO TABS: ORAL_TABLET | ORAL | 0 refills | Status: DC

## 2023-11-08 NOTE — Progress Notes (Addendum)
Date:  11/08/2023   Name:  Erica Snyder   DOB:  [birth year]   MRN:  [id number]   Chief Complaint: Sinusitis (Low grade temp, sinus pressure and pain. Took ibuprofen and advil. Cough- drainage. )  Sinusitis This is a new problem. The current episode started yesterday. The problem has been waxing and waning since onset. Maximum temperature: low grade 99.6. Associated symptoms include congestion. Pertinent negatives include no chills, coughing, diaphoresis, ear pain, headaches, neck pain, shortness of breath, sinus pressure, sneezing, sore throat or swollen glands. (Purulent nasal discharge) Treatments tried: nsaid. The treatment provided no relief.    Lab Results  Component Value Date   NA 139 12/05/2022   K 4.6 12/05/2022   CO2 23 (A) 12/05/2022   GLUCOSE 85 05/18/2022   BUN 22 (A) 12/05/2022   CREATININE 0.9 12/05/2022   CALCIUM 9.7 12/05/2022   EGFR 69 12/05/2022   GFRNONAA 71 04/21/2021   Lab Results  Component Value Date   CHOL 187 12/05/2022   HDL 62 12/05/2022   LDLCALC 114 12/05/2022   TRIG 60 12/05/2022   CHOLHDL 2.7 04/21/2021   Lab Results  Component Value Date   TSH 1.40 12/05/2022   Lab Results  Component Value Date   HGBA1C 5.7 12/05/2022   Lab Results  Component Value Date   WBC 5.9 12/05/2022   HGB 13.6 12/05/2022   HCT 41 12/05/2022   MCV 90 05/18/2022   PLT 271 12/05/2022   Lab Results  Component Value Date   ALT 12 12/05/2022   AST 21 12/05/2022   ALKPHOS 98 12/05/2022   BILITOT 0.3 05/18/2022   No results found for: "25OHVITD2", "25OHVITD3", "VD25OH"   Review of Systems  Constitutional:  Positive for fever. Negative for chills and diaphoresis.  HENT:  Positive for congestion and rhinorrhea. Negative for ear pain, nosebleeds, postnasal drip, sinus pressure, sneezing and sore throat.   Respiratory:  Negative for cough, shortness of breath and wheezing.   Musculoskeletal:  Negative for neck pain.  Neurological:  Negative for  headaches.    Patient Active Problem List   Diagnosis Date Noted   Hx of colonic polyps 07/27/2023   Polyp of sigmoid colon 07/27/2023   Plantar fasciitis, left 12/20/2020   Palpitations 10/11/2018   GERD without esophagitis 01/31/2018   Hypertension 01/31/2018   Menopausal flushing 01/31/2018   Migraine headache 01/31/2018   Neck muscle spasm 01/31/2018   DDD (degenerative disc disease), lumbar 10/18/2017    Allergies  Allergen Reactions   Clarithromycin Nausea And Vomiting and Other (See Comments)    Stomach pain    Levofloxacin Other (See Comments)   Sulfa Antibiotics Rash    Past Surgical History:  Procedure Laterality Date   APPENDECTOMY     COLONOSCOPY  2014   polyps   COLONOSCOPY WITH PROPOFOL N/A 07/27/2023   Procedure: COLONOSCOPY WITH PROPOFOL;  Surgeon: Midge Minium, MD;  Location: [facility];  Service: Endoscopy;  Laterality: N/A;   LASIK     POLYPECTOMY  07/27/2023   Procedure: POLYPECTOMY;  Surgeon: Midge Minium, MD;  Location: [facility];  Service: Endoscopy;;   WRIST FRACTURE SURGERY Right     Social History   Tobacco Use   Smoking status: Never   Smokeless tobacco: Never  Vaping Use   Vaping status: Never Used  Substance Use Topics   Alcohol use: Yes    Alcohol/week: 0.0 standard drinks of alcohol   Drug use: Never     Medication  list has been reviewed and updated.  Current Meds  Medication Sig   albuterol (VENTOLIN HFA) 108 (90 Base) MCG/ACT inhaler Inhale 2 puffs into the lungs every 6 (six) hours as needed for wheezing or shortness of breath.   Boswellia Serrata (BOSWELLIA PO) Take 2 capsules by mouth daily.   butalbital-acetaminophen-caffeine (FIORICET) 50-325-40 MG tablet Take 1 tablet by mouth as needed.   Calcium Carb-Cholecalciferol (CALCIUM 500 + D PO) 1 tablet daily at 6 (six) AM.   cefUROXime (CEFTIN) 500 MG tablet Take 1 tablet (500 mg total) by mouth 2 (two) times daily with a meal for 10 days.   docusate  sodium (COLACE) 100 MG capsule Take 100 mg by mouth daily.   famotidine (PEPCID) 40 MG tablet Take 1 tablet (40 mg total) by mouth daily.   fluticasone (FLONASE) 50 MCG/ACT nasal spray Place 2 sprays into both nostrils daily.   hydrochlorothiazide (HYDRODIURIL) 12.5 MG tablet Take 1 tablet (12.5 mg total) by mouth daily.   loratadine (CLARITIN) 10 MG tablet Take 10 mg by mouth daily.   losartan (COZAAR) 50 MG tablet Take 1 tablet (50 mg total) by mouth daily.   Magnesium 400 MG CAPS Take 1 capsule by mouth daily.   methocarbamol (ROBAXIN) 500 MG tablet Take 1 tablet (500 mg total) by mouth 4 (four) times daily.   nystatin-triamcinolone ointment (MYCOLOG) Apply 1 application topically 2 (two) times daily.   predniSONE (DELTASONE) 10 MG tablet Take 4,4,4,4 3,3,3,3,2,2,2,2, and 1,1,1,1 then stop.   rizatriptan (MAXALT) 10 MG tablet TAKE 1 TABLET BY MOUTH AS NEEDED FOR MIGRAINE. MAY REPEAT IN 2 HOURS IF NEEDED.   Vitamin D, Cholecalciferol, 25 MCG (1000 UT) TABS Take 1 tablet by mouth daily.   [DISCONTINUED] amoxicillin-clavulanate (AUGMENTIN) 875-125 MG tablet Take 1 tablet by mouth 2 (two) times daily.       11/08/2023    9:21 AM 05/17/2023    1:19 PM 02/01/2023   11:28 AM 05/15/2022   11:36 AM  GAD 7 : Generalized Anxiety Score  Nervous, Anxious, on Edge 0 0 0 0  Control/stop worrying 0 0 0 0  Worry too much - different things 0 0 0 0  Trouble relaxing 0 0 0 0  Restless 0 0 0 0  Easily annoyed or irritable 0 0 0 1  Afraid - awful might happen 0 0 0 0  Total GAD 7 Score 0 0 0 1  Anxiety Difficulty Not difficult at all Not difficult at all Not difficult at all Not difficult at all       11/08/2023    9:21 AM 05/17/2023    1:19 PM 02/01/2023   11:27 AM  Depression screen PHQ 2/9  Decreased Interest 0 0 0  Down, Depressed, Hopeless 0 0 0  PHQ - 2 Score 0 0 0  Altered sleeping 0 0 0  Tired, decreased energy 0 0 0  Change in appetite 0 0 0  Feeling bad or failure about yourself  0 0 0   Trouble concentrating 0 0 0  Moving slowly or fidgety/restless 0 0 0  Suicidal thoughts 0 0 0  PHQ-9 Score 0 0 0  Difficult doing work/chores Not difficult at all Not difficult at all Not difficult at all    BP Readings from Last 3 Encounters:  11/08/23 128/72  07/27/23 130/71  05/17/23 100/62    Physical Exam Vitals and nursing note reviewed.  HENT:     Head: Normocephalic.     Right Ear: Tympanic  membrane and ear canal normal.     Left Ear: Tympanic membrane and ear canal normal.     Nose: No congestion or rhinorrhea.     Mouth/Throat:     Mouth: Mucous membranes are moist.     Pharynx: Oropharynx is clear. Uvula midline. No posterior oropharyngeal erythema or postnasal drip.  Pulmonary:     Breath sounds: No wheezing, rhonchi or rales.  Lymphadenopathy:     Head:     Right side of head: No submental, submandibular or tonsillar adenopathy.     Left side of head: Submandibular adenopathy present. No submental or tonsillar adenopathy.     Cervical:     Right cervical: No superficial, deep or posterior cervical adenopathy.    Left cervical: No superficial, deep or posterior cervical adenopathy.  Neurological:     Mental Status: She is alert.     Wt Readings from Last 3 Encounters:  11/08/23 169 lb (76.7 kg)  07/27/23 169 lb (76.7 kg)  05/17/23 166 lb (75.3 kg)    BP 128/72   Pulse 76   Temp 98.1 F (36.7 C) (Oral)   Ht 5\' 4"  (1.626 m)   Wt 169 lb (76.7 kg)   SpO2 97%   BMI 29.01 kg/m   Assessment and Plan:  1. Acute non-recurrent sinusitis of other sinus (Primary) New onset.  Persistent.  Stable.  Patient had low-grade fever last night she is taking a pyretic medication prior to arrival.  Given exposure history we will check COVID and influenza.  Both of these have been noted to be negative see below - POC COVID-19 BinaxNow - POCT Influenza A/B  2. Acute maxillary sinusitis, recurrence not specified New onset.  Persistent.  Stable.  Symptomatology of  purulent nasal drainage with some blood tinge of the mucus as well as nasal congestion consistent with symptomatic concerns for sinusitis as well as physical exam.  We will treat with Ceftin 500 mg 1 twice a day for 10 days and prednisone 20 mg for 7 days followed by 10 mg a day for the following week or may taper as suggested.   Elizabeth Sauer, MD

## 2023-12-11 IMAGING — MG MM DIGITAL SCREENING BILAT W/ TOMO AND CAD
8 series · 8 of 24 positions shown · non-contrast
Comparison: Previous exam(s).

CLINICAL DATA: Screening.

EXAM:
DIGITAL SCREENING BILATERAL MAMMOGRAM WITH TOMOSYNTHESIS AND CAD
TECHNIQUE: Bilateral screening digital craniocaudal and mediolateral oblique
mammograms were obtained. Bilateral screening digital breast
tomosynthesis was performed. The images were evaluated with
computer-aided detection.

[R MLO synth-2D]
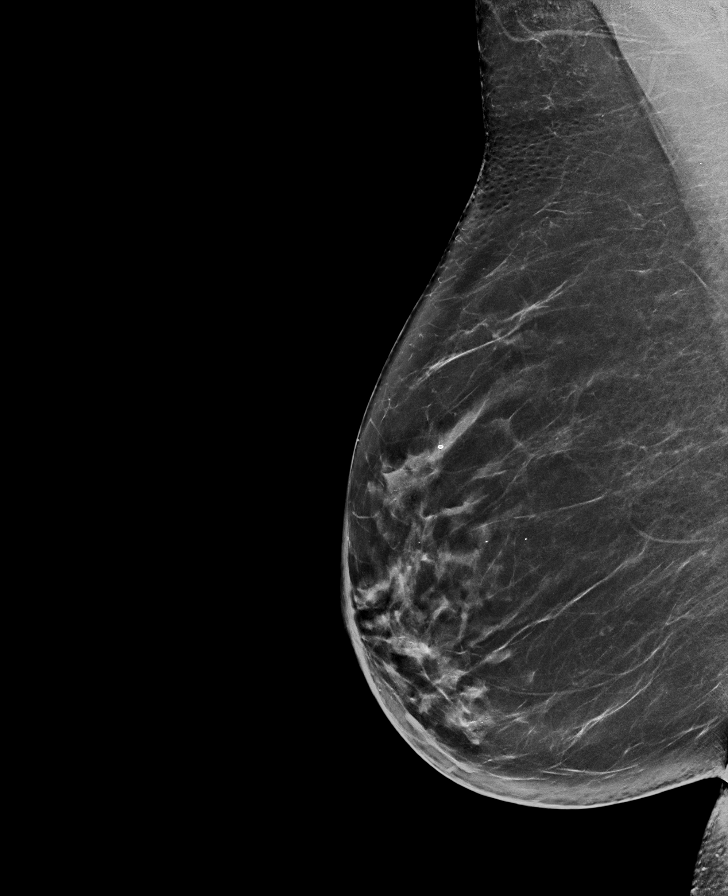

[R CC synth-2D]
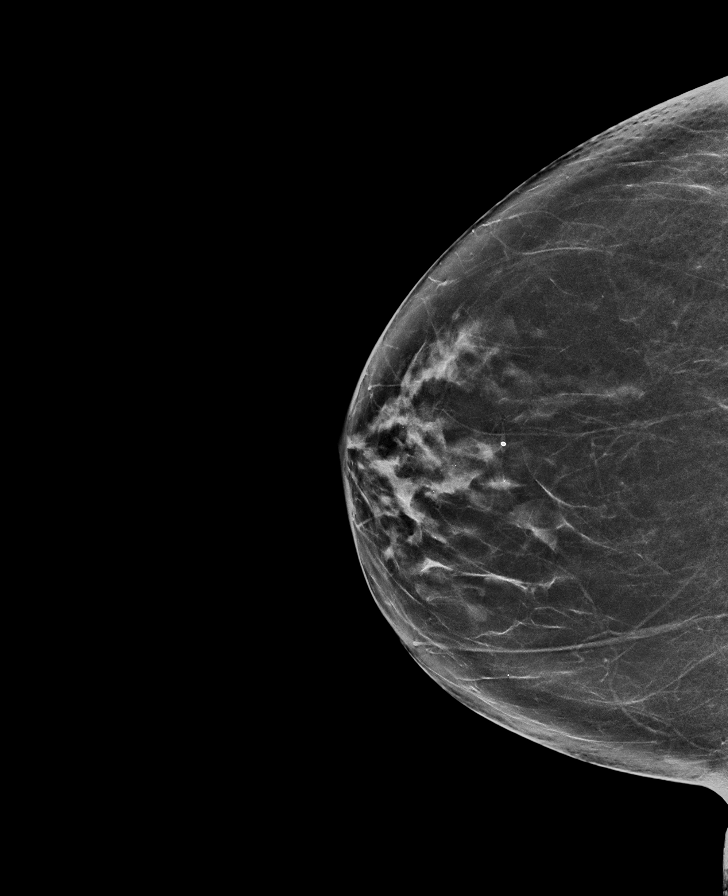

[L CC synth-2D]
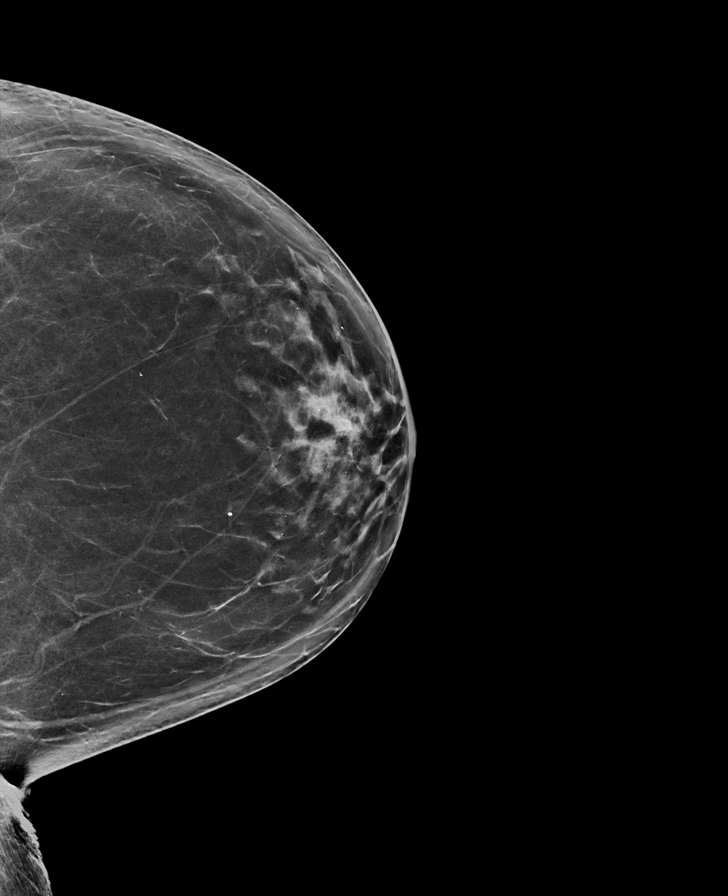

[L MLO synth-2D]
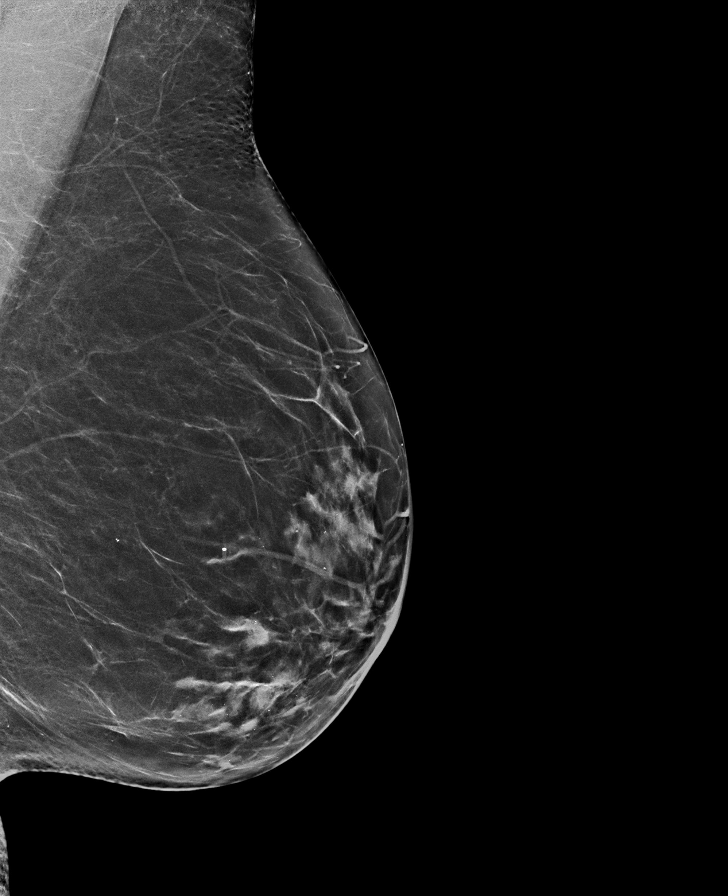

[L CC tomo · tomo slice 38/75.0]
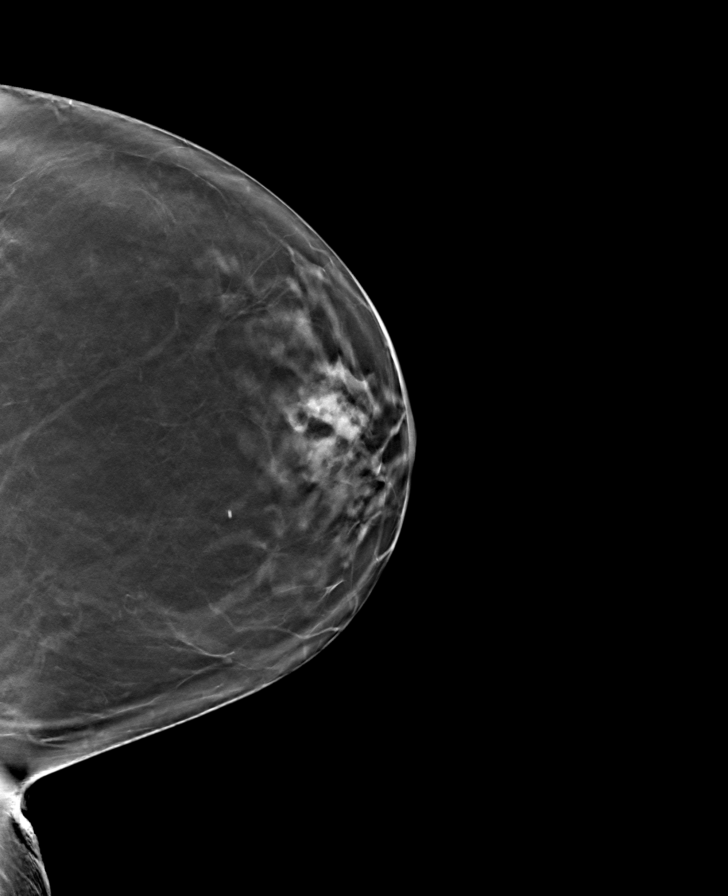

[R MLO tomo · tomo slice 41/80.0]
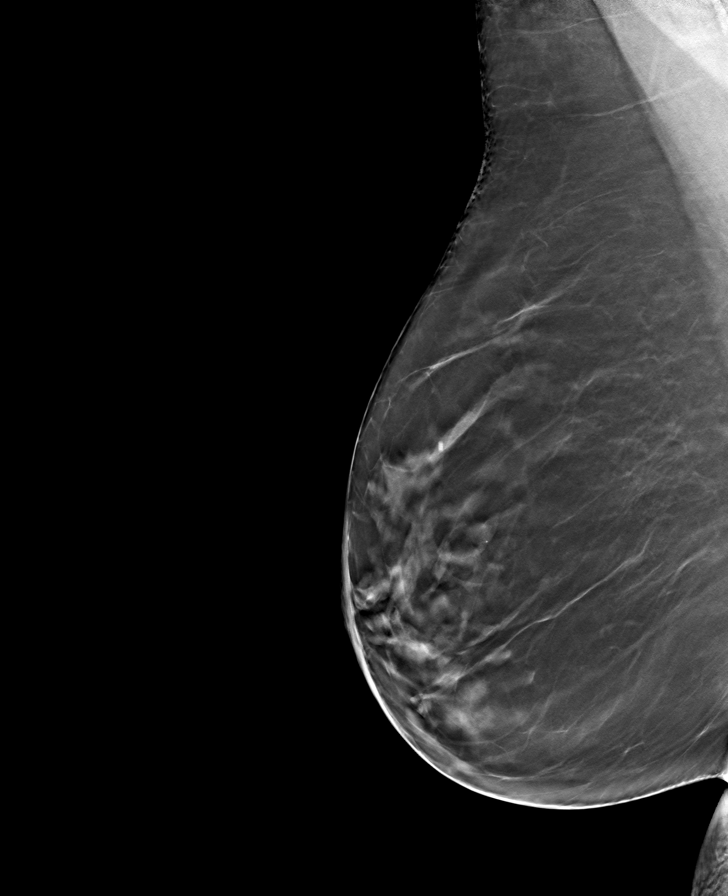

[R CC tomo · tomo slice 37/72.0]
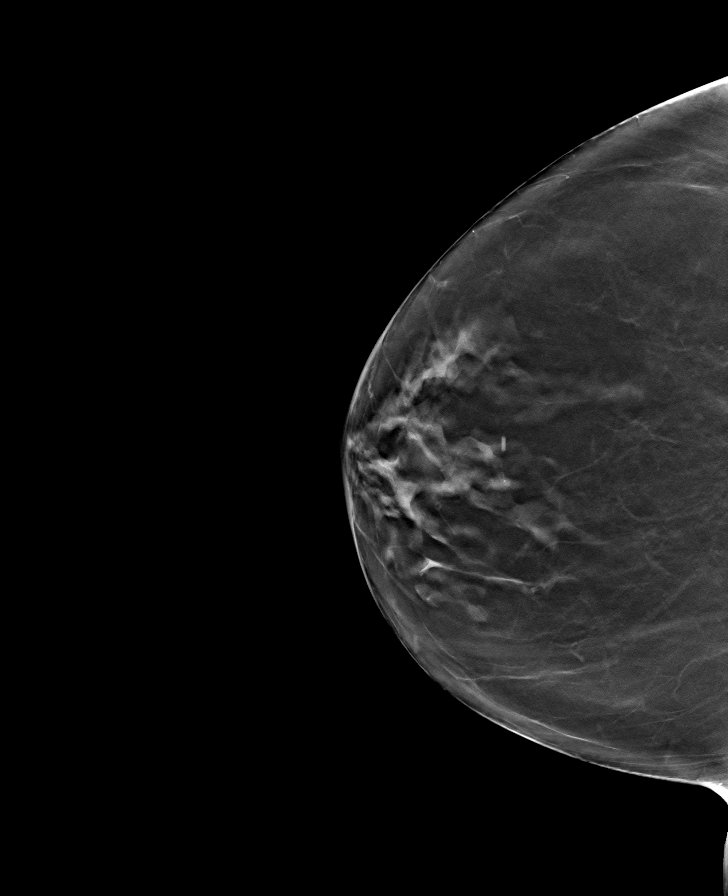

[L MLO tomo · tomo slice 37/73.0]
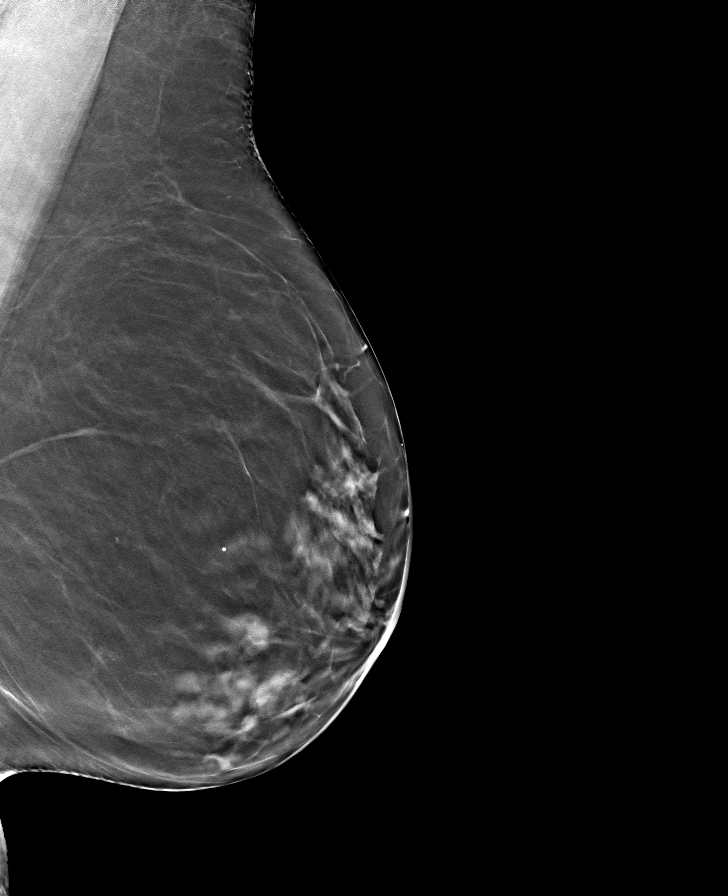

[8 of 24 positions shown; findings below may reference images not displayed]

ACR Breast Density Category b: There are scattered areas of
fibroglandular density.
FINDINGS: There are no findings suspicious for malignancy.
IMPRESSION: No mammographic evidence of malignancy. A result letter of this
screening mammogram will be mailed directly to the patient.

RECOMMENDATION:
Screening mammogram in one year. (Code:51-O-LD2)

BI-RADS CATEGORY  1: Negative.

## 2023-12-31 ENCOUNTER — Other Ambulatory Visit: Payer: Self-pay | Admitting: Family Medicine

## 2023-12-31 ENCOUNTER — Other Ambulatory Visit: Payer: Self-pay

## 2023-12-31 MED ORDER — PREDNISONE 10 MG PO TABS: ORAL_TABLET | ORAL | 0 refills | Status: DC

## 2024-01-11 DIAGNOSIS — L578 Other skin changes due to chronic exposure to nonionizing radiation: Secondary | ICD-10-CM | POA: Diagnosis not present

## 2024-01-11 DIAGNOSIS — Z872 Personal history of diseases of the skin and subcutaneous tissue: Secondary | ICD-10-CM | POA: Diagnosis not present

## 2024-01-11 DIAGNOSIS — Z86018 Personal history of other benign neoplasm: Secondary | ICD-10-CM | POA: Diagnosis not present

## 2024-01-11 DIAGNOSIS — Z85820 Personal history of malignant melanoma of skin: Secondary | ICD-10-CM | POA: Diagnosis not present

## 2024-03-20 ENCOUNTER — Other Ambulatory Visit: Payer: Self-pay

## 2024-03-20 DIAGNOSIS — M79644 Pain in right finger(s): Secondary | ICD-10-CM | POA: Diagnosis not present

## 2024-03-20 DIAGNOSIS — M79646 Pain in unspecified finger(s): Secondary | ICD-10-CM | POA: Insufficient documentation

## 2024-03-20 MED ORDER — AMOXICILLIN-POT CLAVULANATE 875-125 MG PO TABS
1.0000 | ORAL_TABLET | Freq: Two times a day (BID) | ORAL | 0 refills | Status: DC
  Filled 2024-03-20: qty 40, 20d supply, fill #0

## 2024-03-31 ENCOUNTER — Ambulatory Visit (INDEPENDENT_AMBULATORY_CARE_PROVIDER_SITE_OTHER): Admitting: Family Medicine

## 2024-03-31 ENCOUNTER — Other Ambulatory Visit: Payer: Self-pay

## 2024-03-31 VITALS — BP 133/84 | HR 57 | Temp 98.7°F | Resp 18 | Ht 64.0 in | Wt 178.0 lb

## 2024-03-31 DIAGNOSIS — I10 Essential (primary) hypertension: Secondary | ICD-10-CM

## 2024-03-31 DIAGNOSIS — B354 Tinea corporis: Secondary | ICD-10-CM | POA: Diagnosis not present

## 2024-03-31 MED ORDER — HYDROCHLOROTHIAZIDE 12.5 MG PO TABS
12.5000 mg | ORAL_TABLET | Freq: Every day | ORAL | 1 refills | Status: DC
  Filled 2024-03-31: qty 90, 90d supply, fill #0
  Filled 2024-07-27: qty 90, 90d supply, fill #1

## 2024-03-31 MED ORDER — NYSTATIN-TRIAMCINOLONE 100000-0.1 UNIT/GM-% EX OINT
1.0000 | TOPICAL_OINTMENT | Freq: Two times a day (BID) | CUTANEOUS | 1 refills | Status: AC
  Filled 2024-03-31: qty 30, 15d supply, fill #0
  Filled 2024-09-02: qty 30, 15d supply, fill #1

## 2024-03-31 NOTE — Progress Notes (Signed)
 Established Patient Office Visit  Subjective   Patient ID: Erica Snyder, female    DOB: 10-Aug-1961  Age: 63 y.o. MRN: [id number]  Chief Complaint  Patient presents with   Establish Care    HPI Discussed the use of AI scribe software for clinical note transcription with the patient, who gave verbal consent to proceed.  History of Present Illness   Erica Snyder is a 63 year old female who presents to establish care and request medication refills.  She has a history of mild asthma, typically triggered by colds. She has not used her albuterol  inhaler in years and does not have any other medications for asthma.  She experiences occasional back pain, which she manages with Aleve and heat. She had a severe episode about eight years ago, which was resolved with physical therapy. She continues to engage in activities like gardening and weeding.  Her migraines have improved significantly over the years. She experiences headaches that might develop into migraines about once a month, which she manages with Fioricet  and ibuprofen. 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 Her migraines were severe in her twenties but improved after having children.  She had plantar fasciitis in her left foot, which was treated with injections three years ago. She now wears supportive shoes to prevent recurrence.  She experienced palpitations about four years ago, and during an office visit, she was told she was in atrial fibrillation based on an EKG. She was treated with metoprolol  for two weeks, which she discontinued due to fatigue. Her palpitations have since resolved, and she now experiences them infrequently.  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 She has no family history of melanoma, but her sister is also being monitored.  She takes Boswellia for her knees, which she finds  effective for managing creakiness. She purchases it in capsule form from [employer].   She asks for a refill of nystatin -triamcinolone  ointment for yeast infection under her breast.          ROS    Objective:     BP 133/84 (BP Location: Left Arm, Patient Position: Sitting, Cuff Size: Normal)   Pulse (!) 57   Temp 98.7 F (37.1 C) (Oral)   Resp 18   Ht 5' 4 (1.626 m)   Wt 178 lb (80.7 kg)   SpO2 97%   BMI 30.55 kg/m    Physical Exam Vitals and nursing note reviewed.  Constitutional:      Appearance: Normal appearance.  HENT:     Head: Normocephalic and atraumatic.  Eyes:     Conjunctiva/sclera: Conjunctivae normal.  Cardiovascular:     Rate and Rhythm: Normal rate and regular rhythm.  Pulmonary:     Effort: Pulmonary effort is normal.     Breath sounds: Normal breath sounds.  Musculoskeletal:     Right lower leg: No edema.     Left lower leg: No edema.  Skin:    General: Skin is warm and dry.  Neurological:     Mental Status: She is alert and oriented to person, place, and time.  Psychiatric:        Mood and Affect: Mood normal.        Behavior: Behavior normal.        Thought Content: Thought content normal.        Judgment: Judgment normal.  No results found for any visits on 03/31/24.    The 10-year ASCVD risk score (Arnett DK, et al., 2019) is: 6.6%* (Cholesterol units were assumed)    Assessment & Plan:  Tinea corporis -     Nystatin -Triamcinolone ; Apply 1 Application topically 2 (two) times daily.  Dispense: 30 g; Refill: 1  Primary hypertension -     hydroCHLOROthiazide ; Take 1 tablet (12.5 mg total) by mouth daily.  Dispense: 90 tablet; Refill: 1  Assessment and Plan    Asthma Mild asthma, typically induced by viral infections. Well-controlled symptoms with no recent albuterol  use. Valaria discussed but deemed unnecessary due to infrequent symptoms.  Palpitations Infrequent palpitations, approximately one every two months, no  longer uses metoprolol .     Migraine Monthly headaches potentially developing into migraines, effectively managed with Fioricet  and ibuprofen. Occasional ocular migraines without headache. Severe migraines in twenties improved post-childbirth.  Back Pain Intermittent back pain with occasional twinges, managed with Aleve and heat application.   Plantar Fasciitis Plantar fasciitis in left foot, previously treated with injections. Currently managed with appropriate footwear to prevent recurrence.  General Health Maintenance Up to date with Shingrix and tetanus vaccinations. Initial COVID-19 vaccinations received, no boosters. Mammogram in February. Colonoscopy in November 2024. Biannual dermatology follow-up for stage zero melanoma.  Discussed increased melanoma risk for siblings, especially first-degree relatives  - Schedule annual physical in the fall with blood work. - Order mammogram during annual physical. - Administer Prevnar vaccine at next visit. - Continue biannual dermatology check-ups.  Follow-up Plans to establish care for future needs and medication refills. - Refill triamcinolone  and nystatin  cream prescriptions. - Schedule follow-up appointment in four months for physical and mammogram order.         Return in about 4 months (around 08/01/2024).    Easton Sivertson K Bodee Lafoe, MD

## 2024-04-21 ENCOUNTER — Other Ambulatory Visit: Payer: Self-pay

## 2024-05-06 ENCOUNTER — Other Ambulatory Visit: Payer: Self-pay

## 2024-05-16 ENCOUNTER — Other Ambulatory Visit: Payer: Self-pay | Admitting: Medical Genetics

## 2024-05-16 DIAGNOSIS — Z006 Encounter for examination for normal comparison and control in clinical research program: Secondary | ICD-10-CM

## 2024-05-30 LAB — GENECONNECT MOLECULAR SCREEN: Genetic Analysis Overall Interpretation: NEGATIVE

## 2024-07-17 DIAGNOSIS — Z85820 Personal history of malignant melanoma of skin: Secondary | ICD-10-CM | POA: Diagnosis not present

## 2024-07-17 DIAGNOSIS — Z872 Personal history of diseases of the skin and subcutaneous tissue: Secondary | ICD-10-CM | POA: Diagnosis not present

## 2024-07-17 DIAGNOSIS — Z86018 Personal history of other benign neoplasm: Secondary | ICD-10-CM | POA: Diagnosis not present

## 2024-07-17 DIAGNOSIS — L578 Other skin changes due to chronic exposure to nonionizing radiation: Secondary | ICD-10-CM | POA: Diagnosis not present

## 2024-07-24 DIAGNOSIS — H903 Sensorineural hearing loss, bilateral: Secondary | ICD-10-CM | POA: Diagnosis not present

## 2024-08-01 ENCOUNTER — Ambulatory Visit: Admitting: Family Medicine

## 2024-08-01 ENCOUNTER — Encounter: Payer: Self-pay | Admitting: Family Medicine

## 2024-08-01 ENCOUNTER — Other Ambulatory Visit: Payer: Self-pay

## 2024-08-01 VITALS — BP 129/84 | HR 78 | Temp 98.3°F | Resp 18 | Ht 64.0 in | Wt 176.0 lb

## 2024-08-01 DIAGNOSIS — Z23 Encounter for immunization: Secondary | ICD-10-CM

## 2024-08-01 DIAGNOSIS — R7303 Prediabetes: Secondary | ICD-10-CM

## 2024-08-01 DIAGNOSIS — E2839 Other primary ovarian failure: Secondary | ICD-10-CM

## 2024-08-01 DIAGNOSIS — Z0000 Encounter for general adult medical examination without abnormal findings: Secondary | ICD-10-CM | POA: Diagnosis not present

## 2024-08-01 DIAGNOSIS — K219 Gastro-esophageal reflux disease without esophagitis: Secondary | ICD-10-CM

## 2024-08-01 DIAGNOSIS — E559 Vitamin D deficiency, unspecified: Secondary | ICD-10-CM | POA: Diagnosis not present

## 2024-08-01 DIAGNOSIS — Z1231 Encounter for screening mammogram for malignant neoplasm of breast: Secondary | ICD-10-CM | POA: Diagnosis not present

## 2024-08-01 DIAGNOSIS — I10 Essential (primary) hypertension: Secondary | ICD-10-CM | POA: Diagnosis not present

## 2024-08-01 MED ORDER — FAMOTIDINE 40 MG PO TABS
40.0000 mg | ORAL_TABLET | Freq: Every day | ORAL | 3 refills | Status: AC
  Filled 2024-08-01: qty 90, 90d supply, fill #0
  Filled 2024-10-19: qty 90, 90d supply, fill #1

## 2024-08-01 MED ORDER — LOSARTAN POTASSIUM 50 MG PO TABS
50.0000 mg | ORAL_TABLET | Freq: Every day | ORAL | 3 refills | Status: AC
  Filled 2024-08-01: qty 90, 90d supply, fill #0
  Filled 2024-10-19: qty 90, 90d supply, fill #1

## 2024-08-01 NOTE — Progress Notes (Signed)
 Complete physical exam  Patient: Erica Snyder   DOB: 03/02/1961   63 y.o. Female  MRN: [id number]  Subjective:    Chief Complaint  Patient presents with   Annual Exam    Janissa Snyder is a 63 y.o. female who presents today for a complete physical exam. She reports consuming a low fat, low carb, low salt  diet.   She generally feels well. She reports sleeping well. She does not have additional problems to discuss today.   She needs a bone mineral density scan.  Sees Dermatologist every 6 months.  Had a pap in 2023 that was normal.  Never had HPV.  Mammogram is due in January.  Has reflux and occassionally has to take PPI along with famotidine .  Most recent fall risk assessment:    08/01/2024    3:59 PM  Fall Risk   Falls in the past year? 0  Number falls in past yr: 0  Injury with Fall? 0  Risk for fall due to : No Fall Risks  Follow up Falls evaluation completed     Most recent depression screenings:    08/01/2024    3:59 PM 03/31/2024    4:11 PM  PHQ 2/9 Scores  PHQ - 2 Score 0 0  PHQ- 9 Score 0 0      Data saved with a previous flowsheet row definition        Patient Care Team: Tawonda Legaspi K, MD as PCP - General (Family Medicine)   Outpatient Medications Prior to Visit  Medication Sig   albuterol  (VENTOLIN  HFA) 108 (90 Base) MCG/ACT inhaler Inhale 2 puffs into the lungs every 6 (six) hours as needed for wheezing or shortness of breath.   Boswellia Serrata (BOSWELLIA PO) Take 2 capsules by mouth daily.   butalbital -acetaminophen -caffeine  (FIORICET ) 50-325-40 MG tablet Take 1 tablet by mouth as needed.   Calcium Carb-Cholecalciferol (CALCIUM 500 + D PO) 1 tablet daily at 6 (six) AM.   docusate sodium (COLACE) 100 MG capsule Take 100 mg by mouth daily.   fluticasone (FLONASE) 50 MCG/ACT nasal spray Place 2 sprays into both nostrils daily.   hydrochlorothiazide  (HYDRODIURIL ) 12.5 MG tablet Take 1 tablet (12.5 mg total) by mouth daily.    loratadine (CLARITIN) 10 MG tablet Take 10 mg by mouth daily.   Magnesium 400 MG CAPS Take 1 capsule by mouth daily.   methocarbamol  (ROBAXIN ) 500 MG tablet Take 1 tablet (500 mg total) by mouth 4 (four) times daily.   nystatin -triamcinolone  ointment (MYCOLOG) Apply 1 Application topically 2 (two) times daily.   rizatriptan  (MAXALT ) 10 MG tablet TAKE 1 TABLET BY MOUTH AS NEEDED FOR MIGRAINE. MAY REPEAT IN 2 HOURS IF NEEDED.   Vitamin D , Cholecalciferol, 25 MCG (1000 UT) TABS Take 1 tablet by mouth daily.   [DISCONTINUED] famotidine  (PEPCID ) 40 MG tablet Take 1 tablet (40 mg total) by mouth daily.   [DISCONTINUED] losartan  (COZAAR ) 50 MG tablet Take 1 tablet (50 mg total) by mouth daily.   No facility-administered medications prior to visit.    ROS     Objective:    BP 129/84 (BP Location: Left Arm, Patient Position: Sitting, Cuff Size: Normal)   Pulse 78   Temp 98.3 F (36.8 C) (Oral)   Resp 18   Ht 5' 4 (1.626 m)   Wt 176 lb (79.8 kg)   SpO2 95%   BMI 30.21 kg/m    Physical Exam Vitals reviewed.  Constitutional:  Appearance: Normal appearance.  HENT:     Head: Normocephalic.  Eyes:     General:        Right eye: No discharge.        Left eye: No discharge.  Cardiovascular:     Rate and Rhythm: Normal rate.  Pulmonary:     Effort: Pulmonary effort is normal.  Neurological:     Mental Status: She is alert and oriented to person, place, and time.  Psychiatric:        Mood and Affect: Mood normal.        Behavior: Behavior normal.        Thought Content: Thought content normal.        Judgment: Judgment normal.      No results found for any visits on 08/01/24.   The 10-year ASCVD risk score (Arnett DK, et al., 2019) is: 7%* (Cholesterol units were assumed)     Assessment & Plan:    Routine Health Maintenance and Physical Exam  Health Maintenance  Topic Date Due   Pap with HPV screening  10/13/2023   Flu Shot  04/18/2024   COVID-19 Vaccine (3 -  2025-26 season) 05/19/2024   Breast Cancer Screening  10/31/2024   DTaP/Tdap/Td vaccine (2 - Td or Tdap) 01/09/2029   Colon Cancer Screening  07/26/2030   Pneumococcal Vaccine for age over 82  Completed   DEXA scan (bone density measurement)  Completed   Hepatitis C Screening  Completed   Zoster (Shingles) Vaccine  Completed   Hepatitis B Vaccine  Aged Out   Meningitis B Vaccine  Aged Out   HIV Screening  Discontinued    Discussed health benefits of physical activity, and encouraged her to engage in regular exercise appropriate for her age and condition.  Estrogen deficiency -     DG Bone Density; Future  GERD without esophagitis -     Famotidine ; Take 1 tablet (40 mg total) by mouth daily.  Dispense: 90 tablet; Refill: 3  Primary hypertension -     Losartan  Potassium; Take 1 tablet (50 mg total) by mouth daily.  Dispense: 90 tablet; Refill: 3  Encounter for screening mammogram for malignant neoplasm of breast -     Digital Screening Mammogram, Left and Right; Future  Wellness examination -     CBC with Differential/Platelet -     Comprehensive metabolic panel with GFR -     Lipid panel -     TSH + free T4  Vitamin D  deficiency -     VITAMIN D  25 Hydroxy (Vit-D Deficiency, Fractures)  Prediabetes -     Hemoglobin A1c  Immunization due -     Pneumococcal conjugate vaccine 20-valent    Return if symptoms worsen or fail to improve.     Rocklyn Mayberry K [place] Dube, MD

## 2024-08-02 LAB — CBC WITH DIFFERENTIAL/PLATELET
Basophils Absolute: 0 x10E3/uL (ref 0.0–0.2)
Basos: 1 %
EOS (ABSOLUTE): 0.2 x10E3/uL (ref 0.0–0.4)
Eos: 2 %
Hematocrit: 41.4 % (ref 34.0–46.6)
Hemoglobin: 13.5 g/dL (ref 11.1–15.9)
Immature Grans (Abs): 0 x10E3/uL (ref 0.0–0.1)
Immature Granulocytes: 0 %
Lymphocytes Absolute: 2 x10E3/uL (ref 0.7–3.1)
Lymphs: 29 %
MCH: 30.3 pg (ref 26.6–33.0)
MCHC: 32.6 g/dL (ref 31.5–35.7)
MCV: 93 fL (ref 79–97)
Monocytes Absolute: 0.5 x10E3/uL (ref 0.1–0.9)
Monocytes: 8 %
Neutrophils Absolute: 4.2 x10E3/uL (ref 1.4–7.0)
Neutrophils: 60 %
Platelets: 271 x10E3/uL (ref 150–450)
RBC: 4.46 x10E6/uL (ref 3.77–5.28)
RDW: 12.8 % (ref 11.7–15.4)
WBC: 7 x10E3/uL (ref 3.4–10.8)

## 2024-08-02 LAB — COMPREHENSIVE METABOLIC PANEL WITH GFR
ALT: 18 IU/L (ref 0–32)
AST: 24 IU/L (ref 0–40)
Albumin: 4.6 g/dL (ref 3.9–4.9)
Alkaline Phosphatase: 111 IU/L (ref 49–135)
BUN/Creatinine Ratio: 25 (ref 12–28)
BUN: 24 mg/dL (ref 8–27)
Bilirubin Total: 0.2 mg/dL (ref 0.0–1.2)
CO2: 24 mmol/L (ref 20–29)
Calcium: 9.9 mg/dL (ref 8.7–10.3)
Chloride: 99 mmol/L (ref 96–106)
Creatinine, Ser: 0.97 mg/dL (ref 0.57–1.00)
Globulin, Total: 2.7 g/dL (ref 1.5–4.5)
Glucose: 80 mg/dL (ref 70–99)
Potassium: 4.1 mmol/L (ref 3.5–5.2)
Sodium: 138 mmol/L (ref 134–144)
Total Protein: 7.3 g/dL (ref 6.0–8.5)
eGFR: 65 mL/min/1.73 (ref >59)

## 2024-08-02 LAB — TSH+FREE T4
Free T4: 1.19 ng/dL (ref 0.82–1.77)
TSH: 1.37 u[IU]/mL (ref 0.450–4.500)

## 2024-08-02 LAB — HEMOGLOBIN A1C
Est. average glucose Bld gHb Est-mCnc: 120 mg/dL
Hgb A1c MFr Bld: 5.8 % — ABNORMAL HIGH (ref 4.8–5.6)

## 2024-08-02 LAB — VITAMIN D 25 HYDROXY (VIT D DEFICIENCY, FRACTURES): Vit D, 25-Hydroxy: 46.8 ng/mL (ref 30.0–100.0)

## 2024-08-02 LAB — LIPID PANEL
Chol/HDL Ratio: 3.1 ratio (ref 0.0–4.4)
Cholesterol, Total: 194 mg/dL (ref 100–199)
HDL: 62 mg/dL (ref >39)
LDL Chol Calc (NIH): 117 mg/dL — ABNORMAL HIGH (ref 0–99)
Triglycerides: 83 mg/dL (ref 0–149)
VLDL Cholesterol Cal: 15 mg/dL (ref 5–40)

## 2024-08-03 ENCOUNTER — Ambulatory Visit: Payer: Self-pay | Admitting: Family Medicine

## 2024-08-06 ENCOUNTER — Other Ambulatory Visit: Payer: Self-pay | Admitting: Family Medicine

## 2024-08-06 DIAGNOSIS — Z1231 Encounter for screening mammogram for malignant neoplasm of breast: Secondary | ICD-10-CM

## 2024-08-07 DIAGNOSIS — Z0100 Encounter for examination of eyes and vision without abnormal findings: Secondary | ICD-10-CM | POA: Diagnosis not present

## 2024-08-11 DIAGNOSIS — H903 Sensorineural hearing loss, bilateral: Secondary | ICD-10-CM | POA: Diagnosis not present

## 2024-09-02 ENCOUNTER — Other Ambulatory Visit: Payer: Self-pay

## 2024-09-02 ENCOUNTER — Encounter: Payer: Self-pay | Admitting: Family Medicine

## 2024-09-02 ENCOUNTER — Other Ambulatory Visit: Payer: Self-pay | Admitting: Family Medicine

## 2024-09-02 DIAGNOSIS — G43709 Chronic migraine without aura, not intractable, without status migrainosus: Secondary | ICD-10-CM

## 2024-09-02 DIAGNOSIS — M62838 Other muscle spasm: Secondary | ICD-10-CM

## 2024-09-03 ENCOUNTER — Other Ambulatory Visit: Payer: Self-pay

## 2024-09-03 ENCOUNTER — Other Ambulatory Visit: Payer: Self-pay | Admitting: Family Medicine

## 2024-09-03 DIAGNOSIS — M62838 Other muscle spasm: Secondary | ICD-10-CM

## 2024-09-03 DIAGNOSIS — G43709 Chronic migraine without aura, not intractable, without status migrainosus: Secondary | ICD-10-CM

## 2024-09-03 MED ORDER — METHOCARBAMOL 500 MG PO TABS
500.0000 mg | ORAL_TABLET | Freq: Four times a day (QID) | ORAL | 1 refills | Status: AC
  Filled 2024-09-03: qty 100, 25d supply, fill #0

## 2024-09-03 MED ORDER — BUTALBITAL-APAP-CAFFEINE 50-325-40 MG PO TABS
1.0000 | ORAL_TABLET | ORAL | 0 refills | Status: AC | PRN
  Filled 2024-09-03: qty 90, 90d supply, fill #0

## 2024-10-19 ENCOUNTER — Other Ambulatory Visit: Payer: Self-pay | Admitting: Family Medicine

## 2024-10-19 DIAGNOSIS — I10 Essential (primary) hypertension: Secondary | ICD-10-CM

## 2024-10-20 ENCOUNTER — Other Ambulatory Visit: Payer: Self-pay

## 2024-10-20 MED ORDER — HYDROCHLOROTHIAZIDE 12.5 MG PO TABS
12.5000 mg | ORAL_TABLET | Freq: Every day | ORAL | 1 refills | Status: AC
  Filled 2024-10-20: qty 90, 90d supply, fill #0

## 2024-10-21 ENCOUNTER — Other Ambulatory Visit: Payer: Self-pay

## 2024-11-03 ENCOUNTER — Ambulatory Visit
